# Patient Record
Sex: Female | Born: 1955
Health system: Southern US, Community
[De-identification: ages and names within clinical notes are randomized; demographics above are authoritative.]

## PROBLEM LIST (undated history)

## (undated) DIAGNOSIS — F419 Anxiety disorder, unspecified: Secondary | ICD-10-CM

## (undated) DIAGNOSIS — T7840XA Allergy, unspecified, initial encounter: Secondary | ICD-10-CM

## (undated) DIAGNOSIS — D573 Sickle-cell trait: Secondary | ICD-10-CM

## (undated) DIAGNOSIS — Z8719 Personal history of other diseases of the digestive system: Secondary | ICD-10-CM

## (undated) DIAGNOSIS — Z9889 Other specified postprocedural states: Secondary | ICD-10-CM

## (undated) DIAGNOSIS — K219 Gastro-esophageal reflux disease without esophagitis: Secondary | ICD-10-CM

## (undated) HISTORY — DX: Sickle-cell trait: D57.3

## (undated) HISTORY — DX: Other specified postprocedural states: Z98.890

## (undated) HISTORY — PX: TOTAL HIP ARTHROPLASTY: SHX124

## (undated) HISTORY — DX: Personal history of other diseases of the digestive system: Z87.19

## (undated) HISTORY — DX: Gastro-esophageal reflux disease without esophagitis: K21.9

## (undated) HISTORY — DX: Allergy, unspecified, initial encounter: T78.40XA

## (undated) HISTORY — DX: Anxiety disorder, unspecified: F41.9

## (undated) HISTORY — PX: COLONOSCOPY: SHX174

---

## 1999-01-27 ENCOUNTER — Ambulatory Visit (HOSPITAL_COMMUNITY): Admission: RE | Admit: 1999-01-27 | Discharge: 1999-01-27 | Payer: Self-pay | Admitting: Gastroenterology

## 1999-01-27 ENCOUNTER — Encounter: Payer: Self-pay | Admitting: Gastroenterology

## 1999-12-11 ENCOUNTER — Emergency Department (HOSPITAL_COMMUNITY): Admission: EM | Admit: 1999-12-11 | Discharge: 1999-12-11 | Payer: Self-pay | Admitting: Emergency Medicine

## 1999-12-18 ENCOUNTER — Emergency Department (HOSPITAL_COMMUNITY): Admission: EM | Admit: 1999-12-18 | Discharge: 1999-12-18 | Payer: Self-pay | Admitting: Emergency Medicine

## 2000-08-10 ENCOUNTER — Encounter: Payer: Self-pay | Admitting: Obstetrics and Gynecology

## 2000-08-10 ENCOUNTER — Ambulatory Visit (HOSPITAL_COMMUNITY): Admission: RE | Admit: 2000-08-10 | Discharge: 2000-08-10 | Payer: Self-pay | Admitting: Obstetrics and Gynecology

## 2001-06-10 ENCOUNTER — Emergency Department (HOSPITAL_COMMUNITY): Admission: EM | Admit: 2001-06-10 | Discharge: 2001-06-11 | Payer: Self-pay | Admitting: Emergency Medicine

## 2001-06-11 ENCOUNTER — Encounter: Payer: Self-pay | Admitting: Emergency Medicine

## 2001-12-22 ENCOUNTER — Other Ambulatory Visit: Admission: RE | Admit: 2001-12-22 | Discharge: 2001-12-22 | Payer: Self-pay | Admitting: Internal Medicine

## 2003-09-07 ENCOUNTER — Ambulatory Visit (HOSPITAL_COMMUNITY): Admission: RE | Admit: 2003-09-07 | Discharge: 2003-09-07 | Payer: Self-pay | Admitting: Obstetrics and Gynecology

## 2004-06-26 ENCOUNTER — Ambulatory Visit: Payer: Self-pay | Admitting: Internal Medicine

## 2004-10-22 ENCOUNTER — Ambulatory Visit: Payer: Self-pay | Admitting: Internal Medicine

## 2004-10-24 ENCOUNTER — Ambulatory Visit: Payer: Self-pay | Admitting: Internal Medicine

## 2004-11-03 ENCOUNTER — Ambulatory Visit: Payer: Self-pay | Admitting: Internal Medicine

## 2004-11-24 ENCOUNTER — Ambulatory Visit: Payer: Self-pay | Admitting: Gastroenterology

## 2004-11-27 ENCOUNTER — Ambulatory Visit: Payer: Self-pay | Admitting: Internal Medicine

## 2005-03-23 ENCOUNTER — Ambulatory Visit (HOSPITAL_COMMUNITY): Admission: RE | Admit: 2005-03-23 | Discharge: 2005-03-23 | Payer: Self-pay | Admitting: Obstetrics and Gynecology

## 2005-08-19 ENCOUNTER — Ambulatory Visit: Payer: Self-pay | Admitting: Internal Medicine

## 2005-09-04 ENCOUNTER — Ambulatory Visit: Payer: Self-pay | Admitting: Gastroenterology

## 2006-07-09 ENCOUNTER — Ambulatory Visit: Payer: Self-pay | Admitting: Internal Medicine

## 2006-07-09 LAB — CONVERTED CEMR LAB
Albumin: 3.9 g/dL (ref 3.5–5.2)
BUN: 13 mg/dL (ref 6–23)
Basophils Relative: 0.3 % (ref 0.0–1.0)
Bilirubin Urine: NEGATIVE
CO2: 27 meq/L (ref 19–32)
Chol/HDL Ratio, serum: 2.2
Cholesterol: 138 mg/dL (ref 0–200)
Eosinophil percent: 1.5 % (ref 0.0–5.0)
Glomerular Filtration Rate, Af Am: 86 mL/min/{1.73_m2}
LDL Cholesterol: 68 mg/dL (ref 0–99)
Leukocytes, UA: NEGATIVE
MCHC: 32.3 g/dL (ref 30.0–36.0)
Monocytes Absolute: 0.3 10*3/uL (ref 0.2–0.7)
Neutrophils Relative %: 38.5 % — ABNORMAL LOW (ref 43.0–77.0)
Nitrite: NEGATIVE
RBC: 4.08 M/uL (ref 3.87–5.11)
Sodium: 138 meq/L (ref 135–145)
Specific Gravity, Urine: 1.01 (ref 1.000–1.03)
TSH: 0.77 microintl units/mL (ref 0.35–5.50)
Total Bilirubin: 0.5 mg/dL (ref 0.3–1.2)
Total Protein, Urine: NEGATIVE mg/dL
Total Protein: 6.9 g/dL (ref 6.0–8.3)
Urine Glucose: NEGATIVE mg/dL
WBC: 3.9 10*3/uL — ABNORMAL LOW (ref 4.5–10.5)

## 2006-07-13 ENCOUNTER — Ambulatory Visit: Payer: Self-pay | Admitting: Internal Medicine

## 2006-08-30 ENCOUNTER — Ambulatory Visit: Payer: Self-pay | Admitting: Internal Medicine

## 2006-09-28 ENCOUNTER — Encounter: Admission: RE | Admit: 2006-09-28 | Discharge: 2006-09-28 | Payer: Self-pay | Admitting: Otolaryngology

## 2007-04-09 ENCOUNTER — Encounter: Payer: Self-pay | Admitting: Internal Medicine

## 2007-04-09 DIAGNOSIS — K209 Esophagitis, unspecified without bleeding: Secondary | ICD-10-CM | POA: Insufficient documentation

## 2007-04-09 DIAGNOSIS — K589 Irritable bowel syndrome without diarrhea: Secondary | ICD-10-CM

## 2007-04-09 DIAGNOSIS — K219 Gastro-esophageal reflux disease without esophagitis: Secondary | ICD-10-CM | POA: Insufficient documentation

## 2007-04-09 DIAGNOSIS — D509 Iron deficiency anemia, unspecified: Secondary | ICD-10-CM

## 2007-04-09 DIAGNOSIS — Z9109 Other allergy status, other than to drugs and biological substances: Secondary | ICD-10-CM

## 2007-04-15 ENCOUNTER — Encounter: Payer: Self-pay | Admitting: Internal Medicine

## 2007-04-15 DIAGNOSIS — G4700 Insomnia, unspecified: Secondary | ICD-10-CM | POA: Insufficient documentation

## 2007-04-15 DIAGNOSIS — F329 Major depressive disorder, single episode, unspecified: Secondary | ICD-10-CM

## 2007-04-15 DIAGNOSIS — F411 Generalized anxiety disorder: Secondary | ICD-10-CM | POA: Insufficient documentation

## 2007-10-17 ENCOUNTER — Ambulatory Visit: Payer: Self-pay | Admitting: Internal Medicine

## 2007-10-18 ENCOUNTER — Ambulatory Visit: Payer: Self-pay | Admitting: Internal Medicine

## 2007-10-18 LAB — CONVERTED CEMR LAB
Albumin: 4.2 g/dL (ref 3.5–5.2)
Basophils Relative: 0.7 % (ref 0.0–1.0)
Bilirubin, Direct: 0.1 mg/dL (ref 0.0–0.3)
Calcium: 9.4 mg/dL (ref 8.4–10.5)
Chloride: 103 meq/L (ref 96–112)
Cholesterol: 156 mg/dL (ref 0–200)
Eosinophils Relative: 1.8 % (ref 0.0–5.0)
GFR calc Af Amer: 98 mL/min
GFR calc non Af Amer: 81 mL/min
HCT: 37.8 % (ref 36.0–46.0)
LDL Cholesterol: 81 mg/dL (ref 0–99)
Monocytes Relative: 4.8 % (ref 3.0–11.0)
Neutro Abs: 2.3 10*3/uL (ref 1.4–7.7)
Neutrophils Relative %: 48.4 % (ref 43.0–77.0)
RDW: 13.2 % (ref 11.5–14.6)
Total Bilirubin: 0.5 mg/dL (ref 0.3–1.2)
Total CHOL/HDL Ratio: 2.6
Total Protein: 7.6 g/dL (ref 6.0–8.3)
VLDL: 15 mg/dL (ref 0–40)
WBC: 4.7 10*3/uL (ref 4.5–10.5)

## 2007-12-29 ENCOUNTER — Ambulatory Visit: Payer: Self-pay | Admitting: Internal Medicine

## 2007-12-29 DIAGNOSIS — R1032 Left lower quadrant pain: Secondary | ICD-10-CM

## 2007-12-29 DIAGNOSIS — M549 Dorsalgia, unspecified: Secondary | ICD-10-CM | POA: Insufficient documentation

## 2007-12-29 DIAGNOSIS — R609 Edema, unspecified: Secondary | ICD-10-CM | POA: Insufficient documentation

## 2007-12-30 LAB — CONVERTED CEMR LAB
AST: 22 units/L (ref 0–37)
Albumin: 4 g/dL (ref 3.5–5.2)
BUN: 10 mg/dL (ref 6–23)
Bilirubin Urine: NEGATIVE
Bilirubin, Direct: 0.1 mg/dL (ref 0.0–0.3)
CO2: 30 meq/L (ref 19–32)
Eosinophils Absolute: 0.1 10*3/uL (ref 0.0–0.7)
Eosinophils Relative: 1.4 % (ref 0.0–5.0)
GFR calc Af Amer: 85 mL/min
HCT: 33.8 % — ABNORMAL LOW (ref 36.0–46.0)
Hemoglobin: 11.6 g/dL — ABNORMAL LOW (ref 12.0–15.0)
Ketones, ur: NEGATIVE mg/dL
Leukocytes, UA: NEGATIVE
MCHC: 34.3 g/dL (ref 30.0–36.0)
Monocytes Absolute: 0.2 10*3/uL (ref 0.1–1.0)
Neutrophils Relative %: 47.1 % (ref 43.0–77.0)
Nitrite: NEGATIVE
Platelets: 218 10*3/uL (ref 150–400)
Potassium: 4.1 meq/L (ref 3.5–5.1)
RDW: 13.3 % (ref 11.5–14.6)
Sodium: 141 meq/L (ref 135–145)
Specific Gravity, Urine: 1.02 (ref 1.000–1.03)
TSH: 0.6 microintl units/mL (ref 0.35–5.50)
Urine Glucose: NEGATIVE mg/dL
Urobilinogen, UA: 0.2 (ref 0.0–1.0)
pH: 6 (ref 5.0–8.0)

## 2008-02-06 ENCOUNTER — Ambulatory Visit: Payer: Self-pay | Admitting: Internal Medicine

## 2008-02-06 DIAGNOSIS — R42 Dizziness and giddiness: Secondary | ICD-10-CM

## 2008-02-07 ENCOUNTER — Telehealth: Payer: Self-pay | Admitting: Internal Medicine

## 2008-02-10 ENCOUNTER — Telehealth: Payer: Self-pay | Admitting: Internal Medicine

## 2008-10-05 ENCOUNTER — Telehealth (INDEPENDENT_AMBULATORY_CARE_PROVIDER_SITE_OTHER): Payer: Self-pay | Admitting: *Deleted

## 2009-02-04 ENCOUNTER — Encounter (INDEPENDENT_AMBULATORY_CARE_PROVIDER_SITE_OTHER): Payer: Self-pay | Admitting: *Deleted

## 2010-08-27 ENCOUNTER — Emergency Department (HOSPITAL_COMMUNITY)
Admission: EM | Admit: 2010-08-27 | Discharge: 2010-08-27 | Disposition: A | Discharge status: Home / Self Care | Payer: Self-pay | Attending: Emergency Medicine | Admitting: Emergency Medicine

## 2010-08-27 DIAGNOSIS — M25569 Pain in unspecified knee: Secondary | ICD-10-CM | POA: Insufficient documentation

## 2010-08-27 DIAGNOSIS — Z87828 Personal history of other (healed) physical injury and trauma: Secondary | ICD-10-CM | POA: Insufficient documentation

## 2010-12-12 NOTE — Letter (Signed)
February 14, 2008    Noora Locascio  [address]  [place], [place] [address]   RE:  Teresa Orr, Teresa Orr  MRN:  [id number]  /  DOB:  [birth year]   Dear Ms. Roxan Hockey:   This letter is forwarded to you because I find that I am no longer able  to serve as your primary care physician.  This stems from the issue  regarding the misunderstanding over lab work that was drawn  inadvertently and other lab work that was advised to be done such as  specifically the zinc and copper level that you had mentioned at your  last office visit.  The statements regarding legal action over this  issue on your part when you stopped by the office today, make it  impossible for me to continue in the role of your primary care  physician.  Therefore, you are being dismissed as an active patient from  [facility].  You will be afforded emergency care for the next  30 days from receipt of this letter.   It is likely that you will need primary care management and there are  several excellent primary care physicians in the city of [place].  If  you have difficulty finding a new physician, please contact the  physician referal service at [facility] for a suggestion.  At anytime, we will be happy to forward any records you may request to  your new physician.    Sincerely,      Corwin Levins, MD  Electronically Signed    JWJ/MedQ  DD: 02/14/2008  DT: 02/15/2008  Job #: [id number]

## 2011-11-23 ENCOUNTER — Other Ambulatory Visit: Payer: Self-pay | Admitting: Internal Medicine

## 2011-11-23 DIAGNOSIS — Z1231 Encounter for screening mammogram for malignant neoplasm of breast: Secondary | ICD-10-CM

## 2011-12-01 ENCOUNTER — Ambulatory Visit: Payer: Self-pay

## 2011-12-08 ENCOUNTER — Ambulatory Visit: Payer: Self-pay

## 2013-11-06 ENCOUNTER — Encounter: Payer: Self-pay | Admitting: Internal Medicine

## 2013-12-21 ENCOUNTER — Encounter: Payer: Self-pay | Admitting: Internal Medicine

## 2013-12-26 ENCOUNTER — Encounter: Payer: Self-pay | Admitting: Internal Medicine

## 2013-12-26 ENCOUNTER — Ambulatory Visit (INDEPENDENT_AMBULATORY_CARE_PROVIDER_SITE_OTHER): Payer: BC Managed Care – PPO | Admitting: Internal Medicine

## 2013-12-26 ENCOUNTER — Other Ambulatory Visit (INDEPENDENT_AMBULATORY_CARE_PROVIDER_SITE_OTHER): Payer: BC Managed Care – PPO

## 2013-12-26 VITALS — BP 100/72 | HR 80 | Ht 64.75 in | Wt 142.4 lb

## 2013-12-26 DIAGNOSIS — Z1211 Encounter for screening for malignant neoplasm of colon: Secondary | ICD-10-CM

## 2013-12-26 DIAGNOSIS — R194 Change in bowel habit: Secondary | ICD-10-CM

## 2013-12-26 DIAGNOSIS — R198 Other specified symptoms and signs involving the digestive system and abdomen: Secondary | ICD-10-CM

## 2013-12-26 DIAGNOSIS — M545 Low back pain, unspecified: Secondary | ICD-10-CM

## 2013-12-26 DIAGNOSIS — Z7189 Other specified counseling: Secondary | ICD-10-CM

## 2013-12-26 DIAGNOSIS — Z7689 Persons encountering health services in other specified circumstances: Secondary | ICD-10-CM

## 2013-12-26 DIAGNOSIS — K219 Gastro-esophageal reflux disease without esophagitis: Secondary | ICD-10-CM

## 2013-12-26 LAB — COMPREHENSIVE METABOLIC PANEL
ALK PHOS: 73 U/L (ref 39–117)
ALT: 18 U/L (ref 0–35)
AST: 19 U/L (ref 0–37)
Albumin: 4.4 g/dL (ref 3.5–5.2)
BUN: 10 mg/dL (ref 6–23)
CHLORIDE: 106 meq/L (ref 96–112)
CO2: 28 meq/L (ref 19–32)
Calcium: 9.5 mg/dL (ref 8.4–10.5)
Creatinine, Ser: 0.7 mg/dL (ref 0.4–1.2)
GFR: 107.37 mL/min (ref >60.00)
GLUCOSE: 98 mg/dL (ref 70–99)
POTASSIUM: 4.2 meq/L (ref 3.5–5.1)
SODIUM: 141 meq/L (ref 135–145)
TOTAL PROTEIN: 7.7 g/dL (ref 6.0–8.3)
Total Bilirubin: 0.6 mg/dL (ref 0.2–1.2)

## 2013-12-26 LAB — CBC
HCT: 38.3 % (ref 36.0–46.0)
Hemoglobin: 12.5 g/dL (ref 12.0–15.0)
MCHC: 32.6 g/dL (ref 30.0–36.0)
MCV: 87.6 fl (ref 78.0–100.0)
PLATELETS: 225 10*3/uL (ref 150.0–400.0)
RBC: 4.36 Mil/uL (ref 3.87–5.11)
RDW: 13.7 % (ref 11.5–15.5)
WBC: 4 10*3/uL (ref 4.0–10.5)

## 2013-12-26 LAB — TSH: TSH: 0.57 u[IU]/mL (ref 0.35–4.50)

## 2013-12-26 MED ORDER — MOVIPREP 100 G PO SOLR: ORAL | Status: DC

## 2013-12-26 NOTE — Patient Instructions (Addendum)
You have been scheduled for a colonoscopy with propofol. Please follow written instructions given to you at your visit today.  Please pick up your prep kit at the pharmacy within the next 1-3 days. If you use inhalers (even only as needed), please bring them with you on the day of your procedure. Your physician has requested that you go to www.startemmi.com and enter the access code given to you at your visit today. This web site gives a general overview about your procedure. However, you should still follow specific instructions given to you by our office regarding your preparation for the procedure.  Your physician has requested that you go to the basement for the following lab work before leaving today: CBC, CMP, TSH  We have sent the following medications to your pharmacy for you to pick up at your convenience: Moviprep   You have been referred to Primary care physicain Teresa Orr @ [facility]. That office will contact you to schedule your appointment   [address], [place], [place] [address] Phone: [phone]

## 2013-12-26 NOTE — Progress Notes (Signed)
Patient ID: Teresa Orr, female   DOB: 1955/08/05, 58 y.o.   MRN: [id number] HPI:  Teresa Orr is a 58 year old female with a past medical history of anxiety and GERD who is seen to evaluate change in bowel habit and to consider rectal cancer screening. She is here alone today. She reports she has not had primary care recently due to lack of medical insurance. She is now employed and has insurance again. She reports a GI history with Dr. Lyla Son.  She recalls previous upper endoscopy for dilation as well as colonoscopy. She's unsure when these procedures were but thinks they were over 10 years ago. She reports chronic reflux disease currently treated with Tagamet daily. With this she denies heartburn. She's had no return of dysphagia and no odynophagia. She reports good appetite without nausea or vomiting. No early satiety. She has lost about 10 pounds over the last several months which she relates to personal "stress". She does report a change in her bowel habit in that her bowel movements are now more irregular. She reports she has a bowel movement approximately 3-4 days a week but can have multiple bowel movements on a given day. Her stools can be looser than before and also be associated with fecal urgency. Occasionally she has bilateral lower quadrant abdominal discomfort which is sharp in nature. She doesn't always associate this with bowel movement. She reports possible red blood in her stool 1 day but she is uncertain. She does feel there's been a color change and her stools are more green. She reports lower back pain which can radiate into her bilateral buttocks and also posterior leg. No numbness or weakness. She denies a family history of colon cancer or polyps. She requests to reestablish with primary care, but requests a female provider closer to [facility] where she is living.  Past Medical History  Diagnosis Date  . Anxiety   . GERD (gastroesophageal reflux disease)   . Status post  dilation of esophageal narrowing     Past Surgical History  Procedure Laterality Date  . Cesarean section      Current Outpatient Prescriptions  Medication Sig Dispense Refill  . cetirizine (ZYRTEC) 10 MG tablet Take 10 mg by mouth daily.      . Cimetidine (TAGAMET PO) Take 1 tablet by mouth daily.      Marland Kitchen MOVIPREP 100 G SOLR Use per prep instruction  1 kit  0   No current facility-administered medications for this visit.    Allergies  Allergen Reactions  . Esomeprazole Magnesium   . Hydrocod Polst-Cpm Polst Er   . Nexium [Esomeprazole Magnesium]     Family History  Problem Relation Age of Onset  . Adopted: Yes  . Diabetes Other     10 out of 13 siblings  . Lung cancer Mother   . Hypertension Other     siblings    History  Substance Use Topics  . Smoking status: Never Smoker   . Smokeless tobacco: Never Used  . Alcohol Use: Yes     Comment: 1 per day    ROS: As per history of present illness, otherwise negative  BP 100/72  Pulse 80  Ht 5' 4.75" (1.645 m)  Wt 142 lb 6 oz (64.581 kg)  BMI 23.87 kg/m2 Constitutional: Well-developed and well-nourished. No distress. HEENT: Normocephalic and atraumatic. Oropharynx is clear and moist. No oropharyngeal exudate. Conjunctivae are normal.  No scleral icterus. Neck: Neck supple. Trachea midline. Cardiovascular: Normal rate, regular rhythm and  intact distal pulses. No M/R/G Pulmonary/chest: Effort normal and breath sounds normal. No wheezing, rales or rhonchi. Abdominal: Soft, nontender, nondistended. Bowel sounds active throughout. There are no masses palpable. No hepatosplenomegaly. Extremities: no clubbing, cyanosis, or edema Lymphadenopathy: No cervical adenopathy noted. Neurological: Alert and oriented to person place and time. Skin: Skin is warm and dry. No rashes noted. Psychiatric: Normal mood and affect. Behavior is normal.  RELEVANT LABS  Ordered today  -Records from previous endoscopy and colonoscopy not  available in the medical record  ASSESSMENT/PLAN:  58 year old female with a past medical history of anxiety and GERD who is seen to evaluate change in bowel habit and to consider rectal cancer screening.  1.  CRC screening/change in bowel habits -- have recommended colonoscopy for screening and also to evaluate her change in bowel habit. We discussed colonoscopy including the risks and benefits and she is agreeable to proceed. I will check labs today to include CBC, CMP and TSH. I have given her samples of Restora as a probiotic to help regulate bowel movements.  2.  GERD -- no alarm symptoms and symptoms well-controlled H2 blocker. She will continue over-the-counter H2 blocker for now. No recurrent dysphagia and therefore no indication for endoscopy at this time  3.  Lower back pain -- asked that she address this with primary care at her upcoming appointment  4.  Primary care -- referral placed for [facility], request female MD

## 2013-12-27 ENCOUNTER — Encounter: Payer: Self-pay | Admitting: Internal Medicine

## 2014-01-04 ENCOUNTER — Encounter: Payer: Self-pay | Admitting: Internal Medicine

## 2014-01-04 ENCOUNTER — Ambulatory Visit (AMBULATORY_SURGERY_CENTER): Payer: BC Managed Care – PPO | Admitting: Internal Medicine

## 2014-01-04 VITALS — BP 120/85 | HR 78 | Temp 96.3°F | Resp 10 | Ht 64.0 in | Wt 142.0 lb

## 2014-01-04 DIAGNOSIS — Z1211 Encounter for screening for malignant neoplasm of colon: Secondary | ICD-10-CM

## 2014-01-04 DIAGNOSIS — D126 Benign neoplasm of colon, unspecified: Secondary | ICD-10-CM

## 2014-01-04 MED ORDER — SODIUM CHLORIDE 0.9 % IV SOLN: 500.0000 mL | INTRAVENOUS | Status: DC

## 2014-01-04 NOTE — Progress Notes (Signed)
Called to room to assist during endoscopic procedure.  Patient ID and intended procedure confirmed with present staff. Received instructions for my participation in the procedure from the performing physician.  

## 2014-01-04 NOTE — Patient Instructions (Signed)
Discharge instructions given with verbal understanding. Handout on polyps. Resume previous medications. YOU HAD AN ENDOSCOPIC PROCEDURE TODAY AT THE [facility]: Refer to the procedure report that was given to you for any specific questions about what was found during the examination.  If the procedure report does not answer your questions, please call your gastroenterologist to clarify.  If you requested that your care partner not be given the details of your procedure findings, then the procedure report has been included in a sealed envelope for you to review at your convenience later.  YOU SHOULD EXPECT: Some feelings of bloating in the abdomen. Passage of more gas than usual.  Walking can help get rid of the air that was put into your GI tract during the procedure and reduce the bloating. If you had a lower endoscopy (such as a colonoscopy or flexible sigmoidoscopy) you may notice spotting of blood in your stool or on the toilet paper. If you underwent a bowel prep for your procedure, then you may not have a normal bowel movement for a few days.  DIET: Your first meal following the procedure should be a light meal and then it is ok to progress to your normal diet.  A half-sandwich or bowl of soup is an example of a good first meal.  Heavy or fried foods are harder to digest and may make you feel nauseous or bloated.  Likewise meals heavy in dairy and vegetables can cause extra gas to form and this can also increase the bloating.  Drink plenty of fluids but you should avoid alcoholic beverages for 24 hours.  ACTIVITY: Your care partner should take you home directly after the procedure.  You should plan to take it easy, moving slowly for the rest of the day.  You can resume normal activity the day after the procedure however you should NOT DRIVE or use heavy machinery for 24 hours (because of the sedation medicines used during the test).    SYMPTOMS TO REPORT IMMEDIATELY: A  gastroenterologist can be reached at any hour.  During normal business hours, 8:30 AM to 5:00 PM Monday through Friday, call [phone].  After hours and on weekends, please call the GI answering service at [phone] who will take a message and have the physician on call contact you.   Following lower endoscopy (colonoscopy or flexible sigmoidoscopy):  Excessive amounts of blood in the stool  Significant tenderness or worsening of abdominal pains  Swelling of the abdomen that is new, acute  Fever of 100F or higher  FOLLOW UP: If any biopsies were taken you will be contacted by phone or by letter within the next 1-3 weeks.  Call your gastroenterologist if you have not heard about the biopsies in 3 weeks.  Our staff will call the home number listed on your records the next business day following your procedure to check on you and address any questions or concerns that you may have at that time regarding the information given to you following your procedure. This is a courtesy call and so if there is no answer at the home number and we have not heard from you through the emergency physician on call, we will assume that you have returned to your regular daily activities without incident.  SIGNATURES/CONFIDENTIALITY: You and/or your care partner have signed paperwork which will be entered into your electronic medical record.  These signatures attest to the fact that that the information above on your After Visit Summary has been   reviewed and is understood.  Full responsibility of the confidentiality of this discharge information lies with you and/or your care-partner. 

## 2014-01-04 NOTE — Progress Notes (Signed)
Pt stable to RR 

## 2014-01-04 NOTE — Progress Notes (Signed)
Stable to RR 

## 2014-01-04 NOTE — Op Note (Signed)
[place]  [address] [place], [address]   COLONOSCOPY PROCEDURE REPORT  PATIENT: Teresa Orr, Teresa Orr  MR#: [id number] BIRTHDATE: 05-02-1956 , 48  yrs. old GENDER: Female ENDOSCOPIST: Jerene Bears, MD PROCEDURE DATE:  01/04/2014 PROCEDURE:   Colonoscopy with cold biopsy polypectomy First Screening Colonoscopy - Avg.  risk and is 50 yrs.  old or older - No.  Prior Negative Screening - Now for repeat screening. N/A  History of Adenoma - Now for follow-up colonoscopy & has been > or = to 3 yrs.  N/A  Polyps Removed Today? Yes. ASA CLASS:   Class II INDICATIONS:average risk screening and Change in bowel habits. MEDICATIONS: MAC sedation, administered by CRNA and propofol (Diprivan) 200mg  IV  DESCRIPTION OF PROCEDURE:   After the risks benefits and alternatives of the procedure were thoroughly explained, informed consent was obtained.  A digital rectal exam revealed no rectal mass.   The LB [id number]  endoscope was introduced through the anus and advanced to the cecum, which was identified by both the appendix and ileocecal valve. No adverse events experienced. The quality of the prep was good, using MoviPrep  The instrument was then slowly withdrawn as the colon was fully examined.   COLON FINDINGS: Two sessile polyps ranging between 3-28mm in size were found in the transverse colon.  Polypectomy was performed with cold forceps.  All resections were complete and all polyp tissue was completely retrieved.   The colon mucosa was otherwise normal. Retroflexed views revealed no abnormalities. The time to cecum=3 minutes 02 seconds.  Withdrawal time=11 minutes 49 seconds.  The scope was withdrawn and the procedure completed. COMPLICATIONS: There were no complications.  ENDOSCOPIC IMPRESSION: 1.   Two sessile polyps ranging between 3-80mm in size were found in the transverse colon; Polypectomy was performed with cold forceps 2.   The colon mucosa was otherwise  normal  RECOMMENDATIONS: 1.  Await pathology results 2.  If the polyps removed today are proven to be adenomatous (pre-cancerous) polyps, you will need a repeat colonoscopy in 5 years.  Otherwise you should continue to follow colorectal cancer screening guidelines for "routine risk" patients with colonoscopy in 10 years.  You will receive a letter within 1-2 weeks with the results of your biopsy as well as final recommendations.  Please call my office if you have not received a letter after 3 weeks.   eSigned:  Jerene Bears, MD 01/04/2014 12:19 PM   cc: The Patient and Newton Pigg, MD

## 2014-01-05 ENCOUNTER — Telehealth: Payer: Self-pay | Admitting: *Deleted

## 2014-01-05 NOTE — Telephone Encounter (Signed)
No answer, left message to call if questions or concerns. 

## 2014-01-09 ENCOUNTER — Encounter: Payer: Self-pay | Admitting: Internal Medicine

## 2015-01-21 ENCOUNTER — Ambulatory Visit (INDEPENDENT_AMBULATORY_CARE_PROVIDER_SITE_OTHER): Payer: BLUE CROSS/BLUE SHIELD | Admitting: Family Medicine

## 2015-01-21 VITALS — BP 122/80 | HR 66 | Temp 97.6°F | Resp 18 | Ht 65.0 in | Wt 139.0 lb

## 2015-01-21 DIAGNOSIS — N390 Urinary tract infection, site not specified: Secondary | ICD-10-CM

## 2015-01-21 DIAGNOSIS — R5383 Other fatigue: Secondary | ICD-10-CM

## 2015-01-21 DIAGNOSIS — R634 Abnormal weight loss: Secondary | ICD-10-CM | POA: Diagnosis not present

## 2015-01-21 DIAGNOSIS — J302 Other seasonal allergic rhinitis: Secondary | ICD-10-CM | POA: Diagnosis not present

## 2015-01-21 DIAGNOSIS — R42 Dizziness and giddiness: Secondary | ICD-10-CM | POA: Diagnosis not present

## 2015-01-21 LAB — COMPLETE METABOLIC PANEL WITHOUT GFR
ALT: 15 U/L (ref 0–35)
AST: 19 U/L (ref 0–37)
CO2: 28 meq/L (ref 19–32)
Creat: 0.82 mg/dL (ref 0.50–1.10)

## 2015-01-21 LAB — POCT URINALYSIS DIPSTICK
Bilirubin, UA: NEGATIVE
Blood, UA: NEGATIVE
Glucose, UA: NEGATIVE
Ketones, UA: NEGATIVE
Nitrite, UA: NEGATIVE
Protein, UA: NEGATIVE
Spec Grav, UA: 1.015
Urobilinogen, UA: 0.2
pH, UA: 7

## 2015-01-21 LAB — COMPLETE METABOLIC PANEL WITH GFR
Albumin: 4.6 g/dL (ref 3.5–5.2)
Alkaline Phosphatase: 71 U/L (ref 39–117)
BUN: 12 mg/dL (ref 6–23)
Calcium: 9.4 mg/dL (ref 8.4–10.5)
Chloride: 104 mEq/L (ref 96–112)
GFR, Est African American: >89 mL/min
GFR, Est Non African American: 79 mL/min
Glucose, Bld: 95 mg/dL (ref 70–99)
Potassium: 4.1 mEq/L (ref 3.5–5.3)
Sodium: 143 mEq/L (ref 135–145)
Total Bilirubin: 0.3 mg/dL (ref 0.2–1.2)
Total Protein: 7.6 g/dL (ref 6.0–8.3)

## 2015-01-21 LAB — POCT CBC
Granulocyte percent: 39.9 %G (ref 37–80)
HCT, POC: 39 % (ref 37.7–47.9)
Hemoglobin: 12.3 g/dL (ref 12.2–16.2)
Lymph, poc: 2.4 (ref 0.6–3.4)
MCH, POC: 27.1 pg (ref 27–31.2)
MCHC: 31.6 g/dL — AB (ref 31.8–35.4)
MCV: 85.7 fL (ref 80–97)
MID (cbc): 0.3 (ref 0–0.9)
MPV: 7.3 fL (ref 0–99.8)
POC Granulocyte: 1.8 — AB (ref 2–6.9)
POC LYMPH PERCENT: 54.2 % — AB (ref 10–50)
POC MID %: 5.9 %M (ref 0–12)
Platelet Count, POC: 209 10*3/uL (ref 142–424)
RBC: 4.55 M/uL (ref 4.04–5.48)
RDW, POC: 12.8 %
WBC: 4.5 10*3/uL — AB (ref 4.6–10.2)

## 2015-01-21 LAB — VITAMIN B12: Vitamin B-12: 365 pg/mL (ref 211–911)

## 2015-01-21 LAB — POCT UA - MICROSCOPIC ONLY
Bacteria, U Microscopic: NEGATIVE
Casts, Ur, LPF, POC: NEGATIVE
Crystals, Ur, HPF, POC: NEGATIVE
Mucus, UA: NEGATIVE
RBC, urine, microscopic: NEGATIVE
Yeast, UA: NEGATIVE

## 2015-01-21 LAB — GLUCOSE, POCT (MANUAL RESULT ENTRY): POC Glucose: 106 mg/dl — AB (ref 70–99)

## 2015-01-21 LAB — TSH: TSH: 0.678 u[IU]/mL (ref 0.350–4.500)

## 2015-01-21 MED ORDER — CEPHALEXIN 500 MG PO CAPS: 500.0000 mg | ORAL_CAPSULE | Freq: Four times a day (QID) | ORAL | Status: DC

## 2015-01-21 NOTE — Patient Instructions (Signed)

## 2015-01-21 NOTE — Progress Notes (Signed)
 Chief Complaint:  Chief Complaint  Patient presents with  . Fatigue    since yesterday   . GI Problem    stomach just does not feel right   . Headache  . Torticollis    works in a lot dust x1 mth now     HPI: Teresa Orr is a 59 y.o. female who is here for 1 month dizziness, headache, neck stays stiff and she has allergies. She feels sick to her stomach but not anuseated. She is on 3rd shift. She is not eating , she started third shift because she thought she was downsizing and being proactive by switching to another job but it is at a different time. She is .   Her company did not downsize but keep sending people home early.  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   Yesterday she had a frontal HA and took ibuprofen and helped but lasted all day, not the worst of her life, 7 out of 10 diffuse temporal pain , no eye pain, ascribed as a aching pain. No nause or vomiting. But feels stomach "is not feeling good". Not drinking well. No CP SOB or palpitations. Denies any diaphoresis.   She also has had some stomach upsets/cramps , growling. No diarrhea, no constipation, no new foods, meds, travels, no sick contacts.  When her allergies flaire up feels she has the tingles in her sinuses.. Her face feels numb and tingling. Not new for her. The stomacha nd dizziness and weakness is new. She takes zyrtec D   Past Medical History  Diagnosis Date  . Anxiety   . GERD (gastroesophageal reflux disease)   . Status post dilation of esophageal narrowing   . Sickle cell trait    Past Surgical History  Procedure Laterality Date  . Cesarean section    . Colonoscopy     History   Social History  . Marital Status: Single    Spouse Name: N/A  . Number of Children: 3  . Years of Education: N/A   Occupational History  .     Social History Main Topics  . Smoking status: Never Smoker   . Smokeless tobacco: Never Used  . Alcohol Use: 1.2 oz/week    2 Standard drinks or  equivalent per week     Comment: 1 per day  . Drug Use: No  . Sexual Activity: Not on file   Other Topics Concern  . None   Social History Narrative   Family History  Problem Relation Age of Onset  . Adopted: Yes  . Diabetes Other     10 out of 13 siblings  . Lung cancer Mother   . Cancer Mother   . Diabetes Mother   . Hypertension Other     siblings  . Diabetes Sister   . Hyperlipidemia Sister   . Diabetes Brother   . Hyperlipidemia Brother    Allergies  Allergen Reactions  . Esomeprazole Magnesium   . Hydrocod Polst-Cpm Polst Er   . Nexium [Esomeprazole Magnesium]    Prior to Admission medications   Medication Sig Start Date End Date Taking? Authorizing Provider  cetirizine-pseudoephedrine (ZYRTEC-D) 5-120 MG per tablet Take 1 tablet by mouth as needed for allergies.   Yes Historical Provider, MD  Cimetidine (TAGAMET PO) Take 1 tablet by mouth daily.   Yes Historical Provider, MD  diphenhydrAMINE (BENADRYL) 25 MG tablet Take 25 mg by mouth every 6 (six) hours as needed.  Yes Historical Provider, MD  traZODone (DESYREL) 50 MG tablet Take 50 mg by mouth at bedtime.   Yes Historical Provider, MD     ROS: The patient denies fevers, chills, night sweats, unintentional weight loss, chest pain, palpitations, wheezing, dyspnea on exertion, nausea, vomiting, abdominal pain, dysuria, hematuria, melena, numbness,  or tingling.  All other systems have been reviewed and were otherwise negative with the exception of those mentioned in the HPI and as above.    PHYSICAL EXAM: Filed Vitals:   01/21/15 0930  BP: 122/80  Pulse: 66  Temp: 97.6 F (36.4 C)  Resp: 18   Filed Vitals:   01/21/15 0930  Height: 5\' 5"  (1.651 m)  Weight: 139 lb (63.05 kg)   Body mass index is 23.13 kg/(m^2).   General: Alert, no acute distress HEENT:  Normocephalic, atraumatic, oropharynx patent. EOMI, PERRLA, fundoscopic exam normal Minimal sinus tenderness, TM nl, OP nl Cardiovascular:   Regular rate and rhythm, no rubs murmurs or gallops.  No Carotid bruits, radial pulse intact. No pedal edema.  Respiratory: Clear to auscultation bilaterally.  No wheezes, rales, or rhonchi.  No cyanosis, no use of accessory musculature GI: No organomegaly, abdomen is soft and non-tender, positive bowel sounds.  No masses. Skin: No rashes. Neurologic: Facial musculature symmetric. Psychiatric: Patient is appropriate throughout our interaction. Lymphatic: No cervical lymphadenopathy Musculoskeletal: Gait intact. + paramsk tenderness traps and SCM tender NEg Spurling, neg nuchal rigidity  Full ROM 5/5 strength 2/2 DTRs UE    LABS: Results for orders placed or performed in visit on 01/21/15  Urine culture  Result Value Ref Range   Colony Count NO GROWTH    Organism ID, Bacteria NO GROWTH   COMPLETE METABOLIC PANEL WITH GFR  Result Value Ref Range   Sodium 143 135 - 145 mEq/L   Potassium 4.1 3.5 - 5.3 mEq/L   Chloride 104 96 - 112 mEq/L   CO2 28 19 - 32 mEq/L   Glucose, Bld 95 70 - 99 mg/dL   BUN 12 6 - 23 mg/dL   Creat 0.82 0.50 - 1.10 mg/dL   Total Bilirubin 0.3 0.2 - 1.2 mg/dL   Alkaline Phosphatase 71 39 - 117 U/L   AST 19 0 - 37 U/L   ALT 15 0 - 35 U/L   Total Protein 7.6 6.0 - 8.3 g/dL   Albumin 4.6 3.5 - 5.2 g/dL   Calcium 9.4 8.4 - 10.5 mg/dL   GFR, Est African American >89 mL/min   GFR, Est Non African American 79 mL/min  TSH  Result Value Ref Range   TSH 0.678 0.350 - 4.500 uIU/mL  Vitamin D, 25-hydroxy  Result Value Ref Range   Vit D, 25-Hydroxy 19 (L) 30 - 100 ng/mL  Vitamin B12  Result Value Ref Range   Vitamin B-12 365 211 - 911 pg/mL  POCT CBC  Result Value Ref Range   WBC 4.5 (A) 4.6 - 10.2 K/uL   Lymph, poc 2.4 0.6 - 3.4   POC LYMPH PERCENT 54.2 (A) 10 - 50 %L   MID (cbc) 0.3 0 - 0.9   POC MID % 5.9 0 - 12 %M   POC Granulocyte 1.8 (A) 2 - 6.9   Granulocyte percent 39.9 37 - 80 %G   RBC 4.55 4.04 - 5.48 M/uL   Hemoglobin 12.3 12.2 - 16.2 g/dL    HCT, POC 39.0 37.7 - 47.9 %   MCV 85.7 80 - 97 fL   MCH, POC 27.1 27 -  31.2 pg   MCHC 31.6 (A) 31.8 - 35.4 g/dL   RDW, POC 12.8 %   Platelet Count, POC 209 142 - 424 K/uL   MPV 7.3 0 - 99.8 fL  POCT UA - Microscopic Only  Result Value Ref Range   WBC, Ur, HPF, POC 0-4    RBC, urine, microscopic neg    Bacteria, U Microscopic neg    Mucus, UA neg    Epithelial cells, urine per micros 0-1    Crystals, Ur, HPF, POC neg    Casts, Ur, LPF, POC neg    Yeast, UA neg   POCT urinalysis dipstick  Result Value Ref Range   Color, UA yellow    Clarity, UA clear    Glucose, UA neg    Bilirubin, UA neg    Ketones, UA neg    Spec Grav, UA 1.015    Blood, UA neg    pH, UA 7.0    Protein, UA neg    Urobilinogen, UA 0.2    Nitrite, UA neg    Leukocytes, UA small (1+) (A) Negative  POCT glucose (manual entry)  Result Value Ref Range   POC Glucose 106 (A) 70 - 99 mg/dl     EKG/XRAY:   Primary read interpreted by Dr. Marin Comment at [facility].   ASSESSMENT/PLAN: Encounter Diagnoses  Name Primary?  . Other fatigue   . Dizziness and giddiness   . Weight loss   . Seasonal allergies   . Acute UTI Yes   Push fluids Cont with Allergy meds otc NSAIDs prn for neck sprain and strain Labs pending Rx keflex given, urine cx pending.    Gross sideeffects, risk and benefits, and alternatives of medications d/w patient. Patient is aware that all medications have potential sideeffects and we are unable to predict every sideeffect or drug-drug interaction that may occur.  , [place], DO 01/26/2015 8:02 AM

## 2015-01-22 LAB — URINE CULTURE
Colony Count: NO GROWTH
Organism ID, Bacteria: NO GROWTH

## 2015-01-22 LAB — VITAMIN D 25 HYDROXY (VIT D DEFICIENCY, FRACTURES): Vit D, 25-Hydroxy: 19 ng/mL — ABNORMAL LOW (ref 30–100)

## 2015-01-29 ENCOUNTER — Telehealth: Payer: Self-pay

## 2015-01-29 NOTE — Telephone Encounter (Signed)
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

## 2015-01-29 NOTE — Telephone Encounter (Signed)
Pt has questions her medication that she was only given 14 pills and she has taken those and symptoms have not gone away  Please call pt before mid day she works third shift and getting ready to go to be soon

## 2015-01-29 NOTE — Telephone Encounter (Signed)
Patient called again about her previous message. It was marked as high priority previously. Please advise.   [phone]

## 2015-01-30 NOTE — Telephone Encounter (Signed)
Spoke with patient and dw her labs. Sh eis doing somewhat better with vaginal dc, she is feeling the same about tiredness, She does not sleep well. Will come in tomorrow to see me for vaginal exam since urine cx was negative. She states she has "inverted cervix"

## 2015-01-30 NOTE — Telephone Encounter (Signed)
Spoke with pt, she states the medication did not help with her UTI. She states the pharmacist was surprised that she was only given 14 pills. She is still having pain and now back pain. SHe would like another ABX and possibly something for pain. Pt states she has to work and her job requires her to stand up. Please advise.

## 2015-01-31 ENCOUNTER — Telehealth: Payer: Self-pay

## 2015-01-31 ENCOUNTER — Ambulatory Visit (INDEPENDENT_AMBULATORY_CARE_PROVIDER_SITE_OTHER): Payer: BLUE CROSS/BLUE SHIELD | Admitting: Family Medicine

## 2015-01-31 VITALS — BP 100/60 | HR 72 | Temp 97.6°F | Resp 15 | Ht 65.0 in | Wt 139.0 lb

## 2015-01-31 DIAGNOSIS — N898 Other specified noninflammatory disorders of vagina: Secondary | ICD-10-CM

## 2015-01-31 DIAGNOSIS — E559 Vitamin D deficiency, unspecified: Secondary | ICD-10-CM | POA: Diagnosis not present

## 2015-01-31 DIAGNOSIS — G4700 Insomnia, unspecified: Secondary | ICD-10-CM

## 2015-01-31 DIAGNOSIS — M545 Low back pain, unspecified: Secondary | ICD-10-CM

## 2015-01-31 LAB — POCT WET PREP WITH KOH
Clue Cells Wet Prep HPF POC: NEGATIVE
KOH Prep POC: NEGATIVE
Trichomonas, UA: NEGATIVE
Yeast Wet Prep HPF POC: NEGATIVE

## 2015-01-31 MED ORDER — METRONIDAZOLE 0.75 % VA GEL
1.0000 | Freq: Two times a day (BID) | VAGINAL | Status: DC
Start: 2015-01-31 — End: 2016-08-08

## 2015-01-31 MED ORDER — CYCLOBENZAPRINE HCL 5 MG PO TABS: ORAL_TABLET | ORAL | Status: DC

## 2015-01-31 MED ORDER — VITAMIN D (ERGOCALCIFEROL) 1.25 MG (50000 UNIT) PO CAPS: 50000.0000 [IU] | ORAL_CAPSULE | ORAL | Status: DC

## 2015-01-31 NOTE — Telephone Encounter (Signed)
Error

## 2015-01-31 NOTE — Progress Notes (Signed)
Chief Complaint:  Chief Complaint  Patient presents with  . Follow-up    Pain in back and buttocks  . Vaginal Discharge    Onset 4 months    HPI: Teresa Orr is a 59 y.o. female who is here for  *vague cmplaints.  She is still having a hard time sleeping, she is not doing well on the 3rd shift. She has pains all over.  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 She is not used to this She had a recent pap and that was normal She has one partner , they do not live together but have been together for a long time She has difficulty sleeping, going to sleep and maintaining sleep  Last OV from 6/27  Teresa Orr is a 59 y.o. female who is here for 1 month dizziness, headache, neck stays stiff and she has allergies. She feels sick to her stomach but not anuseated. She is on 3rd shift. She is not eating , she started third shift because she thought she was downsizing and being proactive by switching to another job but it is at a different time. She is . Her company did not downsize but keep sending people home early. 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   Yesterday she had a frontal HA and took ibuprofen and helped but lasted all day, not the worst of her life, 7 out of 10 diffuse temporal pain , no eye pain, ascribed as a aching pain. No nause or vomiting. But feels stomach "is not feeling good". Not drinking well. No CP SOB or palpitations. Denies any diaphoresis.   She also has had some stomach upsets/cramps , growling. No diarrhea, no constipation, no new foods, meds, travels, no sick contacts.  When her allergies flaire up feels she has the tingles in her sinuses.. Her face feels numb and tingling. Not new for her. The stomacha  nd dizziness and weakness is new. She takes zyrtec D   Past Medical History  Diagnosis Date  . Anxiety   . GERD (gastroesophageal reflux disease)   . Status post dilation of esophageal narrowing   . Sickle cell trait    Past Surgical History  Procedure Laterality Date  . Cesarean section    . Colonoscopy     History   Social History  . Marital Status: Single    Spouse Name: N/A  . Number of Children: 3  . Years of Education: N/A   Occupational History  .     Social History Main Topics  . Smoking status: Never Smoker   . Smokeless tobacco: Never Used  . Alcohol Use: 1.2 oz/week    2 Standard drinks or equivalent per week     Comment: 1 per day  . Drug Use: No  . Sexual Activity: Not on file   Other Topics Concern  . None   Social History Narrative   Family History  Problem Relation Age of Onset  . Adopted: Yes  . Diabetes Other     10 out of 13 siblings  . Lung cancer Mother   . Cancer Mother   . Diabetes Mother   . Hypertension Other  siblings  . Diabetes Sister   . Hyperlipidemia Sister   . Diabetes Brother   . Hyperlipidemia Brother    Allergies  Allergen Reactions  . Esomeprazole Magnesium   . Hydrocod Polst-Cpm Polst Er   . Nexium [Esomeprazole Magnesium]    Prior to Admission medications   Medication Sig Start Date End Date Taking? Authorizing Provider  cetirizine-pseudoephedrine (ZYRTEC-D) 5-120 MG per tablet Take 1 tablet by mouth as needed for allergies.   Yes Historical Provider, MD  Cimetidine (TAGAMET PO) Take 1 tablet by mouth daily.   Yes Historical Provider, MD  diphenhydrAMINE (BENADRYL) 25 MG tablet Take 25 mg by mouth every 6 (six) hours as needed.   Yes Historical Provider, MD  traZODone (DESYREL) 50 MG tablet Take 50 mg by mouth at bedtime.   Yes Historical Provider, MD  cyclobenzaprine (FLEXERIL) 5 MG tablet Take 1/2-1 tab po qhs prn 01/31/15   Stokes Rattigan P Sherrika Weakland, DO  metroNIDAZOLE (METROGEL) 0.75 % vaginal gel Place 1 Applicatorful  vaginally 2 (two) times daily. For 7 days 01/31/15   Domino Holten P Delania Ferg, DO  Vitamin D, Ergocalciferol, (DRISDOL) 50000 UNITS CAPS capsule Take 1 capsule (50,000 Units total) by mouth every 7 (seven) days. 01/31/15   Vesna Kable P Jadis Pitter, DO     ROS: The patient denies fevers, chills, night sweats, unintentional weight loss, chest pain, palpitations, wheezing, dyspnea on exertion, nausea, vomiting, abdominal pain, dysuria, hematuria, melena, numbness, weakness, or tingling.   All other systems have been reviewed and were otherwise negative with the exception of those mentioned in the HPI and as above.    PHYSICAL EXAM: Filed Vitals:   01/31/15 0901  BP: 100/60  Pulse: 72  Temp: 97.6 F (36.4 C)  Resp: 15   Filed Vitals:   01/31/15 0901  Height: 5\' 5"  (1.651 m)  Weight: 139 lb (63.05 kg)   Body mass index is 23.13 kg/(m^2).   General: Alert, no acute distress HEENT:  Normocephalic, atraumatic, oropharynx patent. EOMI, PERRLA Cardiovascular:  Regular rate and rhythm, no rubs murmurs or gallops.  No Carotid bruits, radial pulse intact. No pedal edema.  Respiratory: Clear to auscultation bilaterally.  No wheezes, rales, or rhonchi.  No cyanosis, no use of accessory musculature GI: No organomegaly, abdomen is soft and non-tender, positive bowel sounds.  No masses. Skin: No rashes. Neurologic: Facial musculature symmetric. Psychiatric: Patient is appropriate throughout our interaction. Lymphatic: No cervical lymphadenopathy Musculoskeletal: Gait intact. + paramsk tenderness  Full ROM 5/5 strength, 2/2 DTRs No saddle anesthesia Straight leg negative Hip and knee exam--normal  Gu-normal, no appreciable dc except waht is normal physiological dc, no masses cervix nl   LABS: Results for orders placed or performed in visit on 01/31/15  POCT Wet Prep with KOH  Result Value Ref Range   Trichomonas, UA Negative    Clue Cells Wet Prep HPF POC neg    Epithelial Wet Prep HPF POC Few Few, Moderate, Many    Yeast Wet Prep HPF POC neg    Bacteria Wet Prep HPF POC Few Few   RBC Wet Prep HPF POC 0-1    WBC Wet Prep HPF POC 5-10    KOH Prep POC Negative      EKG/XRAY:   Primary read interpreted by Dr. Marin Comment at [facility].   ASSESSMENT/PLAN: Encounter Diagnoses  Name Primary?  . Vaginal discharge Yes  . Vitamin D deficiency   . Insomnia   . Right-sided low back pain without sciatica    Vit D def so  rx vit D weekly Rx Diflucan for sxs but nothing objective on wet prep Rx Flexeril may help with insomnia also also msk aches. Decline xrays since [place] [employer] note written Fu in 3 month or prn  Gross sideeffects, risk and benefits, and alternatives of medications d/w patient. Patient is aware that all medications have potential sideeffects and we are unable to predict every sideeffect or drug-drug interaction that may occur.  Dr. Tilden Fossa Marin Comment 01/31/2015 11:13 AM

## 2015-01-31 NOTE — Patient Instructions (Signed)
Back Exercises  Back exercises help treat and prevent back injuries. The goal of back exercises is to increase the strength of your abdominal and back muscles and the flexibility of your back. These exercises should be started when you no longer have back pain. Back exercises include:  · Pelvic Tilt. Lie on your back with your knees bent. Tilt your pelvis until the lower part of your back is against the floor. Hold this position 5 to 10 sec and repeat 5 to 10 times.  · Knee to Chest. Pull first 1 knee up against your chest and hold for 20 to 30 seconds, repeat this with the other knee, and then both knees. This may be done with the other leg straight or bent, whichever feels better.  · Sit-Ups or Curl-Ups. Bend your knees 90 degrees. Start with tilting your pelvis, and do a partial, slow sit-up, lifting your trunk only 30 to 45 degrees off the floor. Take at least 2 to 3 seconds for each sit-up. Do not do sit-ups with your knees out straight. If partial sit-ups are difficult, simply do the above but with only tightening your abdominal muscles and holding it as directed.  · Hip-Lift. Lie on your back with your knees flexed 90 degrees. Push down with your feet and shoulders as you raise your hips a couple inches off the floor; hold for 10 seconds, repeat 5 to 10 times.  · Back arches. Lie on your stomach, propping yourself up on bent elbows. Slowly press on your hands, causing an arch in your low back. Repeat 3 to 5 times. Any initial stiffness and discomfort should lessen with repetition over time.  · Shoulder-Lifts. Lie face down with arms beside your body. Keep hips and torso pressed to floor as you slowly lift your head and shoulders off the floor.  Do not overdo your exercises, especially in the beginning. Exercises may cause you some mild back discomfort which lasts for a few minutes; however, if the pain is more severe, or lasts for more than 15 minutes, do not continue exercises until you see your caregiver.  Improvement with exercise therapy for back problems is slow.   See your caregivers for assistance with developing a proper back exercise program.  Document Released: 08/20/2004 Document Revised: 10/05/2011 Document Reviewed: 05/14/2011  ExitCare® Patient Information ©2015 [employer]. This information is not intended to replace advice given to you by your health care provider. Make sure you discuss any questions you have with your health care provider.

## 2015-02-01 LAB — GC/CHLAMYDIA PROBE AMP
CT Probe RNA: NEGATIVE
GC Probe RNA: NEGATIVE

## 2015-04-13 ENCOUNTER — Telehealth: Payer: Self-pay | Admitting: Family Medicine

## 2015-04-13 DIAGNOSIS — Z131 Encounter for screening for diabetes mellitus: Secondary | ICD-10-CM

## 2015-04-13 DIAGNOSIS — Z13220 Encounter for screening for lipoid disorders: Secondary | ICD-10-CM

## 2015-04-13 NOTE — Telephone Encounter (Signed)
Pt needs biometric screening results faxed/Emailed to [phone] for reduction in insurance premiums; please contact when complete. Documents left in provider's mailbox.  [phone]

## 2015-04-17 NOTE — Telephone Encounter (Signed)
Please call her back and tell her she can pick it up now if she wants.

## 2015-04-17 NOTE — Telephone Encounter (Signed)
Here Teresa Orr.

## 2015-04-17 NOTE — Telephone Encounter (Signed)
I have asked Rodi to call her to let her know that we did get blood, but she did not come in for biometeric blood work, it was not her chief complaint. Patient did not understand from what I was told that this was the issue.   I looked at her chart again and she  Does not have the required blood worl. She needs to come back to get the required blood work,I have put in a  labs only visit which I have put in future orders for her so we can complete the forms in a timely manner or she can come in and get checked in for a physical with fasting blood work. Her form is upfront int he drawer.

## 2015-04-17 NOTE — Telephone Encounter (Signed)
Patient called to check the status of her forms. She states that they are to be faxed by 04/26/2015. Has anyone seen this paperwork?

## 2015-04-20 ENCOUNTER — Other Ambulatory Visit (INDEPENDENT_AMBULATORY_CARE_PROVIDER_SITE_OTHER): Payer: BLUE CROSS/BLUE SHIELD | Admitting: *Deleted

## 2015-04-20 DIAGNOSIS — Z131 Encounter for screening for diabetes mellitus: Secondary | ICD-10-CM

## 2015-04-20 DIAGNOSIS — Z13220 Encounter for screening for lipoid disorders: Secondary | ICD-10-CM | POA: Diagnosis not present

## 2015-04-20 LAB — LIPID PANEL
Cholesterol: 129 mg/dL (ref 125–200)
HDL: 68 mg/dL (ref >=46)
LDL Cholesterol: 50 mg/dL (ref <130)
Total CHOL/HDL Ratio: 1.9 ratio (ref <=5.0)
Triglycerides: 54 mg/dL (ref <150)
VLDL: 11 mg/dL (ref <30)

## 2015-04-20 LAB — GLUCOSE, RANDOM: Glucose, Bld: 99 mg/dL (ref 65–99)

## 2015-04-23 ENCOUNTER — Telehealth: Payer: Self-pay | Admitting: *Deleted

## 2015-04-23 NOTE — Telephone Encounter (Signed)
error 

## 2015-04-23 NOTE — Telephone Encounter (Signed)
Patient called for lab results for her form. She states you have the form and wants the results faxed to the number on the form. Patients number is [phone]

## 2015-04-23 NOTE — Telephone Encounter (Signed)
I  had sent her a mychart question with all her lab results, she did not check it. Need waist circumference by tonight otherwis will fax what I have. Labs are normal.

## 2015-04-23 NOTE — Telephone Encounter (Signed)
Error

## 2015-06-15 ENCOUNTER — Ambulatory Visit (INDEPENDENT_AMBULATORY_CARE_PROVIDER_SITE_OTHER): Payer: BLUE CROSS/BLUE SHIELD | Admitting: Family Medicine

## 2015-06-15 VITALS — BP 136/80 | HR 92 | Temp 97.8°F | Resp 16 | Ht 65.0 in | Wt 142.0 lb

## 2015-06-15 DIAGNOSIS — K219 Gastro-esophageal reflux disease without esophagitis: Secondary | ICD-10-CM

## 2015-06-15 DIAGNOSIS — J0101 Acute recurrent maxillary sinusitis: Secondary | ICD-10-CM

## 2015-06-15 DIAGNOSIS — J302 Other seasonal allergic rhinitis: Secondary | ICD-10-CM

## 2015-06-15 MED ORDER — AMOXICILLIN 500 MG PO TABS: 1000.0000 mg | ORAL_TABLET | Freq: Two times a day (BID) | ORAL | Status: DC

## 2015-06-15 MED ORDER — PREDNISONE 20 MG PO TABS
ORAL_TABLET | ORAL | Status: DC
Start: 2015-06-15 — End: 2016-08-08

## 2015-06-15 NOTE — Progress Notes (Signed)
Subjective:    Patient ID: Teresa Orr, female    DOB: November 10, 1955, 59 y.o.   MRN: [id number]  06/15/2015  Sinus Pressure   HPI This 59 y.o. female presents for evaluation of sinus pressure for one month.  Progressive worsening sinus pressure.  Also having headaches.    No fever but +chills. +HA.  No rhinorrhea; feels sinus pressure; Zyrtec is drying out; Mucinex is not effective.  Nettie pot but has not used.  Does have Flonase OTC and using that.  No pressure. +PND.  +coughing.  Swollen glands.  Must wear ear plugs all the time; ears popping.  Wears ear plugs.  Abdominal pain: no nausea, vomiting, diarrhea; stomach is off; stomach is not right; onset two days ago; normal b.m. Daily.  No diarrhea.  No straining; no bloody stools.  Feels gassy; takes Tagamet PRN; now much improved.  No dysuria, urgency, hematuria, frequency.   Review of Systems  Constitutional: Positive for chills. Negative for fever, diaphoresis and fatigue.  HENT: Positive for sinus pressure. Negative for congestion, ear discharge, ear pain, postnasal drip, rhinorrhea, sore throat, trouble swallowing and voice change.   Respiratory: Positive for cough. Negative for shortness of breath.   Cardiovascular: Negative for chest pain, palpitations and leg swelling.  Gastrointestinal: Positive for abdominal distention. Negative for nausea, vomiting, abdominal pain, diarrhea, constipation, blood in stool, anal bleeding and rectal pain.       +gassy  Genitourinary: Negative for dysuria, urgency, frequency and hematuria.    Past Medical History  Diagnosis Date  . Anxiety   . GERD (gastroesophageal reflux disease)   . Status post dilation of esophageal narrowing   . Sickle cell trait [facility])    Past Surgical History  Procedure Laterality Date  . Cesarean section    . Colonoscopy     Allergies  Allergen Reactions  . Esomeprazole Magnesium   . Hydrocod Polst-Cpm Polst Er   . Nexium [Esomeprazole Magnesium]     Social  History   Social History  . Marital Status: Single    Spouse Name: N/A  . Number of Children: 3  . Years of Education: N/A   Occupational History  .     Social History Main Topics  . Smoking status: Never Smoker   . Smokeless tobacco: Never Used  . Alcohol Use: 1.2 oz/week    2 Standard drinks or equivalent per week     Comment: 1 per day  . Drug Use: No  . Sexual Activity: Not on file   Other Topics Concern  . Not on file   Social History Narrative   Family History  Problem Relation Age of Onset  . Adopted: Yes  . Diabetes Other     10 out of 13 siblings  . Lung cancer Mother   . Cancer Mother   . Diabetes Mother   . Hypertension Other     siblings  . Diabetes Sister   . Hyperlipidemia Sister   . Diabetes Brother   . Hyperlipidemia Brother        Objective:    BP 136/80 mmHg  Pulse 92  Temp(Src) 97.8 F (36.6 C) (Oral)  Resp 16  Ht 5\' 5"  (1.651 m)  Wt 142 lb (64.411 kg)  BMI 23.63 kg/m2  SpO2 98% Physical Exam  Constitutional: She is oriented to person, place, and time. She appears well-developed and well-nourished. No distress.  HENT:  Head: Normocephalic and atraumatic.  Right Ear: External ear normal.  Left Ear: External ear  normal.  Nose: Mucosal edema present. No rhinorrhea. Right sinus exhibits maxillary sinus tenderness. Right sinus exhibits no frontal sinus tenderness. Left sinus exhibits maxillary sinus tenderness. Left sinus exhibits no frontal sinus tenderness.  Mouth/Throat: Uvula is midline, oropharynx is clear and moist and mucous membranes are normal. No oropharyngeal exudate, posterior oropharyngeal edema or posterior oropharyngeal erythema.  Eyes: Conjunctivae are normal. Pupils are equal, round, and reactive to light.  Neck: Normal range of motion. Neck supple.  Cardiovascular: Normal rate, regular rhythm and normal heart sounds.  Exam reveals no gallop and no friction rub.   No murmur heard. Pulmonary/Chest: Effort normal and breath  sounds normal. She has no wheezes. She has no rales.  Abdominal: Soft. Bowel sounds are normal. She exhibits no distension and no mass. There is no tenderness. There is no rebound and no guarding.  Neurological: She is alert and oriented to person, place, and time.  Skin: Skin is warm and dry. She is not diaphoretic.  Psychiatric: She has a normal mood and affect. Her behavior is normal.  Nursing note and vitals reviewed.      Assessment & Plan:   1. Acute recurrent maxillary sinusitis   2. Other seasonal allergic rhinitis   3. Gastroesophageal reflux disease without esophagitis     1. Acute recurrent maxillary sinusitis with allergic rhinitis:  New/recurrent; rx for Amoxicillin and Prednisone provided.  Start [employer] daily; continue [facility]. 2.  GERD/gas: New. Restart Tagamet.   No orders of the defined types were placed in this encounter.   Meds ordered this encounter  Medications  . amoxicillin (AMOXIL) 500 MG tablet    Sig: Take 2 tablets (1,000 mg total) by mouth 2 (two) times daily.    Dispense:  40 tablet    Refill:  0  . predniSONE (DELTASONE) 20 MG tablet    Sig: Two tablets daily x 5 days then one tablet for days    Dispense:  15 tablet    Refill:  0    No Follow-up on file.    Ismaeel Arvelo Elayne Guerin, M.D. [facility] [address] [employer] [place], [place]  [address] [phone] phone [phone] fax

## 2015-06-15 NOTE — Patient Instructions (Signed)
1.  Restart Tagamet. 2.  Restart Netttie Pot daily. 3.  Continue Flonase daily. 4.  Start Amoxicillin twice daily.  Sinusitis, Adult Sinusitis is redness, soreness, and inflammation of the paranasal sinuses. Paranasal sinuses are air pockets within the bones of your face. They are located beneath your eyes, in the middle of your forehead, and above your eyes. In healthy paranasal sinuses, mucus is able to drain out, and air is able to circulate through them by way of your nose. However, when your paranasal sinuses are inflamed, mucus and air can become trapped. This can allow bacteria and other germs to grow and cause infection. Sinusitis can develop quickly and last only a short time (acute) or continue over a long period (chronic). Sinusitis that lasts for more than 12 weeks is considered chronic. CAUSES Causes of sinusitis include:  Allergies.  Structural abnormalities, such as displacement of the cartilage that separates your nostrils (deviated septum), which can decrease the air flow through your nose and sinuses and affect sinus drainage.  Functional abnormalities, such as when the small hairs (cilia) that line your sinuses and help remove mucus do not work properly or are not present. SIGNS AND SYMPTOMS Symptoms of acute and chronic sinusitis are the same. The primary symptoms are pain and pressure around the affected sinuses. Other symptoms include:  Upper toothache.  Earache.  Headache.  Bad breath.  Decreased sense of smell and taste.  A cough, which worsens when you are lying flat.  Fatigue.  Fever.  Thick drainage from your nose, which often is green and may contain pus (purulent).  Swelling and warmth over the affected sinuses. DIAGNOSIS Your health care provider will perform a physical exam. During your exam, your health care provider may perform any of the following to help determine if you have acute sinusitis or chronic sinusitis:  Look in your nose for signs  of abnormal growths in your nostrils (nasal polyps).  Tap over the affected sinus to check for signs of infection.  View the inside of your sinuses using an imaging device that has a light attached (endoscope). If your health care provider suspects that you have chronic sinusitis, one or more of the following tests may be recommended:  Allergy tests.  Nasal culture. A sample of mucus is taken from your nose, sent to a lab, and screened for bacteria.  Nasal cytology. A sample of mucus is taken from your nose and examined by your health care provider to determine if your sinusitis is related to an allergy. TREATMENT Most cases of acute sinusitis are related to a viral infection and will resolve on their own within 10 days. Sometimes, medicines are prescribed to help relieve symptoms of both acute and chronic sinusitis. These may include pain medicines, decongestants, nasal steroid sprays, or saline sprays. However, for sinusitis related to a bacterial infection, your health care provider will prescribe antibiotic medicines. These are medicines that will help kill the bacteria causing the infection. Rarely, sinusitis is caused by a fungal infection. In these cases, your health care provider will prescribe antifungal medicine. For some cases of chronic sinusitis, surgery is needed. Generally, these are cases in which sinusitis recurs more than 3 times per year, despite other treatments. HOME CARE INSTRUCTIONS  Drink plenty of water. Water helps thin the mucus so your sinuses can drain more easily.  Use a humidifier.  Inhale steam 3-4 times a day (for example, sit in the bathroom with the shower running).  Apply a warm, moist washcloth  to your face 3-4 times a day, or as directed by your health care provider.  Use saline nasal sprays to help moisten and clean your sinuses.  Take medicines only as directed by your health care provider.  If you were prescribed either an antibiotic or  antifungal medicine, finish it all even if you start to feel better. SEEK IMMEDIATE MEDICAL CARE IF:  You have increasing pain or severe headaches.  You have nausea, vomiting, or drowsiness.  You have swelling around your face.  You have vision problems.  You have a stiff neck.  You have difficulty breathing.   This information is not intended to replace advice given to you by your health care provider. Make sure you discuss any questions you have with your health care provider.   Document Released: 07/13/2005 Document Revised: 08/03/2014 Document Reviewed: 07/28/2011 Elsevier Interactive Patient Education [employer].

## 2015-09-19 ENCOUNTER — Emergency Department (HOSPITAL_COMMUNITY)
Admission: EM | Admit: 2015-09-19 | Discharge: 2015-09-19 | Disposition: A | Discharge status: Home / Self Care | Payer: BLUE CROSS/BLUE SHIELD | Attending: Emergency Medicine | Admitting: Emergency Medicine

## 2015-09-19 ENCOUNTER — Encounter (HOSPITAL_COMMUNITY): Payer: Self-pay | Admitting: Emergency Medicine

## 2015-09-19 DIAGNOSIS — R0981 Nasal congestion: Secondary | ICD-10-CM

## 2015-09-19 DIAGNOSIS — Z8719 Personal history of other diseases of the digestive system: Secondary | ICD-10-CM | POA: Insufficient documentation

## 2015-09-19 DIAGNOSIS — J3489 Other specified disorders of nose and nasal sinuses: Secondary | ICD-10-CM | POA: Diagnosis not present

## 2015-09-19 DIAGNOSIS — Z792 Long term (current) use of antibiotics: Secondary | ICD-10-CM | POA: Insufficient documentation

## 2015-09-19 DIAGNOSIS — Z862 Personal history of diseases of the blood and blood-forming organs and certain disorders involving the immune mechanism: Secondary | ICD-10-CM | POA: Diagnosis not present

## 2015-09-19 DIAGNOSIS — F419 Anxiety disorder, unspecified: Secondary | ICD-10-CM | POA: Diagnosis not present

## 2015-09-19 DIAGNOSIS — J029 Acute pharyngitis, unspecified: Secondary | ICD-10-CM | POA: Diagnosis present

## 2015-09-19 LAB — RAPID STREP SCREEN (MED CTR MEBANE ONLY): Streptococcus, Group A Screen (Direct): NEGATIVE

## 2015-09-19 MED ORDER — IBUPROFEN 800 MG PO TABS
800.0000 mg | ORAL_TABLET | Freq: Once | ORAL | Status: DC
Start: 2015-09-19 — End: 2015-09-19
  Filled 2015-09-19: qty 1

## 2015-09-19 MED ORDER — CETIRIZINE HCL 10 MG PO TABS: 10.0000 mg | ORAL_TABLET | Freq: Every day | ORAL | Status: DC

## 2015-09-19 MED ORDER — PREDNISONE 20 MG PO TABS: 40.0000 mg | ORAL_TABLET | Freq: Every day | ORAL | Status: DC

## 2015-09-19 MED ORDER — DEXAMETHASONE SODIUM PHOSPHATE 10 MG/ML IJ SOLN
10.0000 mg | Freq: Once | INTRAMUSCULAR | Status: DC
  Filled 2015-09-19: qty 1

## 2015-09-19 MED ORDER — METHOCARBAMOL 500 MG PO TABS: 500.0000 mg | ORAL_TABLET | Freq: Two times a day (BID) | ORAL | Status: DC

## 2015-09-19 NOTE — ED Notes (Signed)
Pt asking to leave before strep test is resolved. Needing to go to work.

## 2015-09-19 NOTE — Discharge Instructions (Signed)
You were seen in the ER today for evaluation of sore throat, sinus congestion, and neck pain. As we discussed I suspect your symptoms might be due to post nasal drip, allergies, or a virus. I do not think you have a bacterial infection of your throat or sinuses. However, per your request we are doing a strep test for your throat. We will call you if it is positive. I also gave you a few prescriptions to try to alleviate your symptoms. You may also take ibuprofen 800mg  three times daily for the pain. If your symptoms persist, please follow up with ENT (contact info attached). Please also follow up with your primary care provider within one week. Return to the ER for new or worsening symptoms.

## 2015-09-19 NOTE — ED Provider Notes (Signed)
CSN: [id number]     Arrival date & time 09/19/15  [id number] History   First MD Initiated Contact with Patient 09/19/15 1006     Chief Complaint  Patient presents with  . Sore Throat    HPI   [name] is an 60 y.o. female with history of anxiety, GERD who presents to the ED for evaluation of sore throat, sinus congestion, and neck pain. She states her symptoms began almost two months ago. She states that for "a while" she has noticed slowly increasing nasal/sinus congestion and intermittent throat irritation. States that last month it got noticeably worse and she went to see ENT for evaluation. She was apparently told she had a normal exam and prescribed Augmentin and tylenol. She states she did not finish the Augmentin as it gave her diarrhea. She states she is here today because she wants a throat culture. She states she frequently had strep throat growing up and wants her throat cultured today. She states that she has also tried flonase with some relief of her congestion, though still feels her throat is irritated. She states her throat looks red at home in the mirror. She denies fever, chills. Denies dysphagia, trismus, drooling. Denies neck stiffness, though states the left side of her neck feels painful and tight at times. She states she used to take Zyrtec for allergies which also seemed to help. Denies cough. Denies chest pain or SOB.   Past Medical History  Diagnosis Date  . Anxiety   . GERD (gastroesophageal reflux disease)   . Status post dilation of esophageal narrowing   . Sickle cell trait [facility])    Past Surgical History  Procedure Laterality Date  . Cesarean section    . Colonoscopy     Family History  Problem Relation Age of Onset  . Adopted: Yes  . Diabetes Other     10 out of 13 siblings  . Lung cancer Mother   . Cancer Mother   . Diabetes Mother   . Hypertension Other     siblings  . Diabetes Sister   . Hyperlipidemia Sister   . Diabetes Brother   . Hyperlipidemia  Brother    Social History  Substance Use Topics  . Smoking status: Never Smoker   . Smokeless tobacco: Never Used  . Alcohol Use: 1.2 oz/week    2 Standard drinks or equivalent per week     Comment: 1 per day   OB History    No data available     Review of Systems  All other systems reviewed and are negative.     Allergies  Esomeprazole magnesium; Hydrocod polst-cpm polst er; and Nexium  Home Medications   Prior to Admission medications   Medication Sig Start Date End Date Taking? Authorizing Provider  amoxicillin (AMOXIL) 500 MG tablet Take 2 tablets (1,000 mg total) by mouth 2 (two) times daily. 06/15/15   Wardell Honour, MD  cetirizine-pseudoephedrine (ZYRTEC-D) 5-120 MG per tablet Take 1 tablet by mouth as needed for allergies.    Historical Provider, MD  Cimetidine (TAGAMET PO) Take 1 tablet by mouth daily.    Historical Provider, MD  cyclobenzaprine (FLEXERIL) 5 MG tablet Take 1/2-1 tab po qhs prn Patient not taking: Reported on 06/15/2015 01/31/15   Thao P Le, DO  diphenhydrAMINE (BENADRYL) 25 MG tablet Take 25 mg by mouth every 6 (six) hours as needed.    Historical Provider, MD  metroNIDAZOLE (METROGEL) 0.75 % vaginal gel Place 1 Applicatorful vaginally 2 (  two) times daily. For 7 days Patient not taking: Reported on 06/15/2015 01/31/15   Thao P Le, DO  predniSONE (DELTASONE) 20 MG tablet Two tablets daily x 5 days then one tablet for days 06/15/15   Wardell Honour, MD  traZODone (DESYREL) 50 MG tablet Take 50 mg by mouth at bedtime.    Historical Provider, MD  Vitamin D, Ergocalciferol, (DRISDOL) 50000 UNITS CAPS capsule Take 1 capsule (50,000 Units total) by mouth every 7 (seven) days. Patient not taking: Reported on 06/15/2015 01/31/15   Thao P Le, DO   BP 136/81 mmHg  Pulse 78  Temp(Src) 97.7 F (36.5 C) (Oral)  Resp 16  SpO2 100% Physical Exam  Constitutional: She is oriented to person, place, and time. No distress.  HENT:  Head: Atraumatic.  Right Ear:  Tympanic membrane and external ear normal.  Left Ear: Tympanic membrane and external ear normal.  Nose: Nose normal.  Mild posterior oropharyngeal erythema and cobblestoning. No tonsillar or posterior oropharyngeal edema. No tonsillar exudate. There is one white spot on left tonsil that is likely impacted food/tonsilolith. Uvula midline.  Eyes: Conjunctivae are normal. No scleral icterus.  Neck: Neck supple.  No cervical LAD. No c-spine or paraspinal tenderness. FROM with no pain.   Cardiovascular: Normal rate and regular rhythm.   Pulmonary/Chest: Effort normal. No respiratory distress. She exhibits no tenderness.  Abdominal: Soft. She exhibits no distension. There is no tenderness.  Neurological: She is alert and oriented to person, place, and time.  Skin: Skin is warm and dry. She is not diaphoretic.  Psychiatric: Her behavior is normal. Her mood appears anxious.  Nursing note and vitals reviewed.   ED Course  Procedures (including critical care time) Labs Review Labs Reviewed  RAPID STREP SCREEN (NOT AT [facility])  CULTURE, GROUP A STREP [facility])    Imaging Review No results found. I have personally reviewed and evaluated these images and lab results as part of my medical decision-making.   EKG Interpretation None      MDM   Final diagnoses:  Sore throat  Sinus congestion    I discussed with pt at this time my ddx includes viral vs allergic sinusitis/pharyngitis, post nasal drip, GERD. Pt afebrile with nonfocal exam. Given low suspicion for bacterial infection I think it is reasonable that pt do not continue her Augmentin rx. Per pt's request, rapid strep/GAS culture obtained. I have low suspicion for strep pharyngitis at this time. Rx given for robaxin, prednisone, and zyrtec. Pt has flonase at home which I encouraged her to continue using PRN. Instructed her to take ibuprofen as needed as well. Referral to ENT given for f/u if symptoms persist. Instructed to f/u with PCP. ER  return precautions given.     Anne Ng, PA-C 09/19/15 1129  Daleen Bo, MD 09/19/15 1739

## 2015-09-19 NOTE — ED Notes (Signed)
Pt c/o sore throat, sinus congestion, swollen gland to left side, blister to left posterior throat, throbbing sensation down into left shoulder x 1 month. States she has been seen for the same.

## 2015-09-22 LAB — CULTURE, GROUP A STREP (THRC)

## 2015-12-26 ENCOUNTER — Ambulatory Visit: Payer: BLUE CROSS/BLUE SHIELD

## 2016-01-22 ENCOUNTER — Emergency Department (HOSPITAL_COMMUNITY)
Admission: EM | Admit: 2016-01-22 | Discharge: 2016-01-22 | Disposition: A | Discharge status: Home / Self Care | Payer: BLUE CROSS/BLUE SHIELD | Attending: Emergency Medicine | Admitting: Emergency Medicine

## 2016-01-22 ENCOUNTER — Emergency Department (HOSPITAL_COMMUNITY): Payer: BLUE CROSS/BLUE SHIELD

## 2016-01-22 ENCOUNTER — Encounter (HOSPITAL_COMMUNITY): Payer: Self-pay

## 2016-01-22 DIAGNOSIS — R531 Weakness: Secondary | ICD-10-CM | POA: Diagnosis not present

## 2016-01-22 DIAGNOSIS — Z79899 Other long term (current) drug therapy: Secondary | ICD-10-CM | POA: Diagnosis not present

## 2016-01-22 DIAGNOSIS — R200 Anesthesia of skin: Secondary | ICD-10-CM | POA: Insufficient documentation

## 2016-01-22 DIAGNOSIS — I6789 Other cerebrovascular disease: Secondary | ICD-10-CM | POA: Diagnosis not present

## 2016-01-22 DIAGNOSIS — G459 Transient cerebral ischemic attack, unspecified: Secondary | ICD-10-CM | POA: Diagnosis present

## 2016-01-22 DIAGNOSIS — R42 Dizziness and giddiness: Secondary | ICD-10-CM | POA: Diagnosis not present

## 2016-01-22 LAB — CBC WITH DIFFERENTIAL/PLATELET
Basophils Absolute: 0 10*3/uL (ref 0.0–0.1)
Basophils Relative: 0 %
EOS ABS: 0.1 10*3/uL (ref 0.0–0.7)
Eosinophils Relative: 2 %
HCT: 37.5 % (ref 36.0–46.0)
HEMOGLOBIN: 12.1 g/dL (ref 12.0–15.0)
LYMPHS ABS: 1.9 10*3/uL (ref 0.7–4.0)
Lymphocytes Relative: 52 %
MCH: 28 pg (ref 26.0–34.0)
MCHC: 32.3 g/dL (ref 30.0–36.0)
MCV: 86.8 fL (ref 78.0–100.0)
MONOS PCT: 6 %
Monocytes Absolute: 0.2 10*3/uL (ref 0.1–1.0)
NEUTROS PCT: 40 %
Neutro Abs: 1.5 10*3/uL — ABNORMAL LOW (ref 1.7–7.7)
Platelets: 197 10*3/uL (ref 150–400)
RBC: 4.32 MIL/uL (ref 3.87–5.11)
RDW: 12.7 % (ref 11.5–15.5)
WBC: 3.7 10*3/uL — ABNORMAL LOW (ref 4.0–10.5)

## 2016-01-22 LAB — BASIC METABOLIC PANEL
Anion gap: 6 (ref 5–15)
BUN: 10 mg/dL (ref 6–20)
CHLORIDE: 107 mmol/L (ref 101–111)
CO2: 27 mmol/L (ref 22–32)
CREATININE: 0.67 mg/dL (ref 0.44–1.00)
Calcium: 9.1 mg/dL (ref 8.9–10.3)
GFR calc Af Amer: >60 mL/min (ref >60)
GFR calc non Af Amer: >60 mL/min (ref >60)
GLUCOSE: 87 mg/dL (ref 65–99)
Potassium: 4 mmol/L (ref 3.5–5.1)
SODIUM: 140 mmol/L (ref 135–145)

## 2016-01-22 LAB — I-STAT TROPONIN, ED: TROPONIN I, POC: 0 ng/mL (ref 0.00–0.08)

## 2016-01-22 NOTE — ED Notes (Signed)
Patient transported to MRI 

## 2016-01-22 NOTE — Discharge Instructions (Signed)
Paresthesia Paresthesia is an abnormal burning or prickling sensation. This sensation is generally felt in the hands, arms, legs, or feet. However, it may occur in any part of the body. Usually, it is not painful. The feeling may be described as:  Tingling or numbness.  Pins and needles.  Skin crawling.  Buzzing.  Limbs falling asleep.  Itching. Most people experience temporary (transient) paresthesia at some time in their lives. Paresthesia may occur when you breathe too quickly (hyperventilation). It can also occur without any apparent cause. Commonly, paresthesia occurs when pressure is placed on a nerve. The sensation quickly goes away after the pressure is removed. For some people, however, paresthesia is a long-lasting (chronic) condition that is caused by an underlying disorder. If you continue to have paresthesia, you may need further medical evaluation. HOME CARE INSTRUCTIONS Watch your condition for any changes. Taking the following actions may help to lessen any discomfort that you are feeling:  Avoid drinking alcohol.  Try acupuncture or massage to help relieve your symptoms.  Keep all follow-up visits as directed by your health care provider. This is important. SEEK MEDICAL CARE IF:  You continue to have episodes of paresthesia.  Your burning or prickling feeling gets worse when you walk.  You have pain, cramps, or dizziness.  You develop a rash. SEEK IMMEDIATE MEDICAL CARE IF:  You feel weak.  You have trouble walking or moving.  You have problems with speech, understanding, or vision.  You feel confused.  You cannot control your bladder or bowel movements.  You have numbness after an injury.  You faint.   This information is not intended to replace advice given to you by your health care provider. Make sure you discuss any questions you have with your health care provider.   Document Released: 07/03/2002 Document Revised: 11/27/2014 Document Reviewed:  07/09/2014 Elsevier Interactive Patient Education 2016 [employer] Inc[employer]  

## 2016-01-22 NOTE — ED Provider Notes (Addendum)
CSN: [id number]     Arrival date & time 01/22/16  1611 History  By signing my name below, I, Teresa Orr, attest that this documentation has been prepared under the direction and in the presence of Blanchie Dessert, MD . Electronically Signed: Rowan Orr, Scribe. 01/22/2016. 4:35 PM.   Chief Complaint  Patient presents with  . Transient Ischemic Attack    The history is provided by the patient. No language interpreter was used.   HPI Comments:  Teresa Orr is a 60 y.o. female with PMHx of anxiety and GERD who presents to the Emergency Department via EMS complaining of sudden onset light-headedness and weakness. Pt had been sitting in a meeting then stood up to leave. Around 6-7 minutes later, pt's coworker noticed pt's left eye was drooping. Pt experienced subsequent light-headedness, weakness in both legs and the sensation her heart was racing. She sat down and symptoms gradually subsided. Pt currently feels mildly weak and light-headed. Pt reports eating breakfast and doesn't think she was dehydrated. LNMP 9 years ago. She notes FHx of DM, HTN and cancer. Pt denies h/o smoking and uses ETOH socially. Denies nausea, vision changes, any pain, swelling or weakness in one leg, abdominal pain, urinary symptoms, h/o HTN, or neck pain.  Past Medical History  Diagnosis Date  . Anxiety   . GERD (gastroesophageal reflux disease)   . Status post dilation of esophageal narrowing   . Sickle cell trait [facility])    Past Surgical History  Procedure Laterality Date  . Cesarean section    . Colonoscopy     Family History  Problem Relation Age of Onset  . Adopted: Yes  . Diabetes Other     10 out of 13 siblings  . Lung cancer Mother   . Cancer Mother   . Diabetes Mother   . Hypertension Other     siblings  . Diabetes Sister   . Hyperlipidemia Sister   . Diabetes Brother   . Hyperlipidemia Brother    Social History  Substance Use Topics  . Smoking status: Never Smoker   .  Smokeless tobacco: Never Used  . Alcohol Use: 1.2 oz/week    2 Standard drinks or equivalent per week     Comment: 1 per day   OB History    No data available     Review of Systems  Cardiovascular: Positive for palpitations.  Neurological: Positive for facial asymmetry, weakness and light-headedness.  All other systems reviewed and are negative.   Allergies  Nexium and Tussionex pennkinetic er  Home Medications   Prior to Admission medications   Medication Sig Start Date End Date Taking? Authorizing Provider  cetirizine (ZYRTEC) 10 MG tablet Take 1 tablet (10 mg total) by mouth daily. 09/19/15  Yes Olivia Canter Sam, PA-C  cimetidine (TAGAMET) 200 MG tablet Take 200 mg by mouth daily as needed (for heartburn).   Yes Historical Provider, MD  diphenhydrAMINE (BENADRYL) 25 MG tablet Take 25 mg by mouth every 6 (six) hours as needed for itching or allergies.    Yes Historical Provider, MD  fluticasone (FLONASE) 50 MCG/ACT nasal spray Place 1 spray into both nostrils daily as needed for allergies.  01/01/16  Yes Historical Provider, MD  amoxicillin (AMOXIL) 500 MG tablet Take 2 tablets (1,000 mg total) by mouth 2 (two) times daily. 06/15/15   Wardell Honour, MD  cyclobenzaprine (FLEXERIL) 5 MG tablet Take 1/2-1 tab po qhs prn Patient not taking: Reported on 06/15/2015 01/31/15   Thao P  Le, DO  methocarbamol (ROBAXIN) 500 MG tablet Take 1 tablet (500 mg total) by mouth 2 (two) times daily. 09/19/15   Olivia Canter Sam, PA-C  metroNIDAZOLE (METROGEL) 0.75 % vaginal gel Place 1 Applicatorful vaginally 2 (two) times daily. For 7 days Patient not taking: Reported on 06/15/2015 01/31/15   Thao P Le, DO  predniSONE (DELTASONE) 20 MG tablet Two tablets daily x 5 days then one tablet for days 06/15/15   Wardell Honour, MD  predniSONE (DELTASONE) 20 MG tablet Take 2 tablets (40 mg total) by mouth daily. 09/19/15   Olivia Canter Sam, PA-C  Vitamin D, Ergocalciferol, (DRISDOL) 50000 UNITS CAPS capsule Take 1 capsule  (50,000 Units total) by mouth every 7 (seven) days. Patient not taking: Reported on 06/15/2015 01/31/15   Thao P Le, DO   BP 130/77 mmHg  Pulse 84  Temp(Src) 98 F (36.7 C) (Oral)  Resp 11  Ht 5\' 7"  (1.702 m)  Wt 145 lb (65.772 kg)  BMI 22.71 kg/m2  SpO2 100%   Physical Exam  Constitutional: She is oriented to person, place, and time. She appears well-developed and well-nourished. No distress.  HENT:  Head: Normocephalic and atraumatic.  Eyes: EOM are normal.  Neck: Normal range of motion.  Cardiovascular: Normal rate, regular rhythm and normal heart sounds.   Pulmonary/Chest: Effort normal and breath sounds normal.  Abdominal: Soft. She exhibits no distension. There is no tenderness.  Musculoskeletal: Normal range of motion.  Neurological: She is alert and oriented to person, place, and time.  CN II-XII intact, EOMs intact, no visual field cuts, no pronator drift, strength; finger to nose normal, heel to shin normal; gait is normal.  Negative Romberg. No carotid bruits.  Skin: Skin is warm and dry.  Psychiatric: She has a normal mood and affect. Judgment normal.  Nursing note and vitals reviewed.   ED Course  Procedures  DIAGNOSTIC STUDIES:  Oxygen Saturation is 100% on RA, normal by my interpretation.    COORDINATION OF CARE:  4:30 PM Will order head CT, CBC, and BMP. Discussed treatment plan with pt at bedside and pt agreed to plan.  Labs Review Labs Reviewed  CBC WITH DIFFERENTIAL/PLATELET - Abnormal; Notable for the following:    WBC 3.7 (*)    Neutro Abs 1.5 (*)    All other components within normal limits  BASIC METABOLIC PANEL  Randolm Idol, ED    Imaging Review Dg Chest 2 View  01/22/2016  CLINICAL DATA:  Dizziness. EXAM: CHEST  2 VIEW COMPARISON:  None. FINDINGS: The heart size and mediastinal contours are within normal limits. Both lungs are clear. The visualized skeletal structures are unremarkable. IMPRESSION: No active cardiopulmonary disease.  Electronically Signed   By: Misty Stanley M.D.   On: 01/22/2016 17:19   Ct Head Wo Contrast  01/22/2016  CLINICAL DATA:  Left-sided numbness and tingling. EXAM: CT HEAD WITHOUT CONTRAST TECHNIQUE: Contiguous axial images were obtained from the base of the skull through the vertex without intravenous contrast. COMPARISON:  None. FINDINGS: Brain: No evidence of acute infarction, hemorrhage, extra-axial collection, ventriculomegaly, or mass effect. Vascular: No hyperdense vessel or unexpected calcification. Skull: Negative for fracture or focal lesion. Sinuses/Orbits: No acute findings. Other: None. IMPRESSION: No acute intracranial abnormality identified. Electronically Signed   By: Abelardo Diesel M.D.   On: 01/22/2016 17:38   Mr Brain Wo Contrast  01/22/2016  CLINICAL DATA:  Initial evaluation for acute left-sided numbness and facial droop EXAM: MRI HEAD WITHOUT CONTRAST TECHNIQUE: Multiplanar,  multiecho pulse sequences of the brain and surrounding structures were obtained without intravenous contrast. COMPARISON:  Prior CT from earlier the same day FINDINGS: Cerebral volume within normal limits for patient age. Patchy T2/FLAIR hyperintensity present within the periventricular and deep white matter both cerebral hemispheres, nonspecific, but most likely related to chronic small vessel ischemic disease, mild in nature. No abnormal foci of restricted diffusion to suggest acute intracranial infarct. Gray-white matter differentiation is maintained. Major intracranial vascular flow voids are preserved. No acute or chronic intracranial hemorrhage. No areas of chronic infarction. No mass lesion, midline shift, or mass effect. No hydrocephalus. No extra-axial fluid collection. Major dural sinuses are grossly patent. Craniocervical junction is normal. Visualized upper cervical spine unremarkable. Pituitary gland within normal limits. No acute abnormality about the globes and orbits. Paranasal sinuses are clear. No  mastoid effusion. Inner ear structures grossly unremarkable. Bone marrow signal intensity within normal limits. No scalp soft tissue abnormality. IMPRESSION: 1. No acute intracranial infarct or other process identified. 2. Mild chronic small vessel ischemic disease. Electronically Signed   By: Jeannine Boga M.D.   On: 01/22/2016 22:27   I have personally reviewed and evaluated these images and lab results as part of my medical decision-making.   EKG Interpretation   Date/Time:  Wednesday January 22 2016 16:22:00 EDT Ventricular Rate:  86 PR Interval:    QRS Duration: 80 QT Interval:  377 QTC Calculation: 451 R Axis:   75 Text Interpretation:  Sinus rhythm Normal ECG Confirmed by Maryan Rued  MD,  Loree Fee ([id number]) on 01/22/2016 4:39:27 PM      MDM   Final diagnoses:  Left facial numbness   Patient is a healthy 60 year old female presenting today with left facial droop that started spontaneously that think all start to feel dizzy and generally weak. The symptoms lasted a proximally 10 minutes and now she just has a mild irregular sensation in the left side of her face. Her neuro exam is within normal limits.  Labs are within normal limits. No carotid bruits and she does not have history of hypertension, diabetes, drug use or alcohol use. CT of the head within normal limits. Spoke with Dr. Nicole Kindred who recommended MRI to rule out small TIA. If that is normal she compiled with neurology. Patient for cardiac cause of her symptoms.  10:45 PM MRI neg for acute pathology.  No cause for pt sx today but low suspicion for stroke or seizure.  Will give f/u with neuro. I personally performed the services described in this documentation, which was scribed in my presence.  The recorded information has been reviewed and considered.   Blanchie Dessert, MD 01/22/16 [id number]  Blanchie Dessert, MD 01/22/16 [phone]

## 2016-01-22 NOTE — ED Notes (Signed)
Patient presents to the ed from work where she was walking out of a meeting and her friend noticed she was drooping on her left side and the patient states she felt like all of the blood rushed out of her head and to her feet to where she felt dizzy and weak bilaterally, patients symptoms then subsided by ems arrival at 80, pt presents to er alert, oriented, denies pain, ambulatory but still feels dizzy and is clammy, er md at bedside during triage

## 2016-02-04 DIAGNOSIS — M25532 Pain in left wrist: Secondary | ICD-10-CM | POA: Diagnosis not present

## 2016-02-04 DIAGNOSIS — M79642 Pain in left hand: Secondary | ICD-10-CM | POA: Diagnosis not present

## 2016-03-13 ENCOUNTER — Ambulatory Visit: Payer: BLUE CROSS/BLUE SHIELD | Admitting: Neurology

## 2016-08-08 ENCOUNTER — Ambulatory Visit (HOSPITAL_COMMUNITY)
Admission: EM | Admit: 2016-08-08 | Discharge: 2016-08-08 | Disposition: A | Discharge status: Home / Self Care | Payer: BLUE CROSS/BLUE SHIELD | Attending: Internal Medicine | Admitting: Internal Medicine

## 2016-08-08 ENCOUNTER — Encounter (HOSPITAL_COMMUNITY): Payer: Self-pay | Admitting: Emergency Medicine

## 2016-08-08 DIAGNOSIS — R05 Cough: Secondary | ICD-10-CM

## 2016-08-08 DIAGNOSIS — R059 Cough, unspecified: Secondary | ICD-10-CM

## 2016-08-08 DIAGNOSIS — J0100 Acute maxillary sinusitis, unspecified: Secondary | ICD-10-CM | POA: Diagnosis not present

## 2016-08-08 MED ORDER — AMOXICILLIN-POT CLAVULANATE 875-125 MG PO TABS: 1.0000 | ORAL_TABLET | Freq: Two times a day (BID) | ORAL | Status: DC

## 2016-08-08 MED ORDER — BENZONATATE 100 MG PO CAPS: 100.0000 mg | ORAL_CAPSULE | Freq: Three times a day (TID) | ORAL | 0 refills | Status: AC

## 2016-08-08 MED ORDER — FLUTICASONE PROPIONATE 50 MCG/ACT NA SUSP: 2.0000 | Freq: Every day | NASAL | 1 refills | Status: DC

## 2016-08-08 NOTE — ED Triage Notes (Signed)
The patient presented to the [facility] with a complaint of a cough, sinus pressure and a sore throat x6 days.

## 2016-08-08 NOTE — ED Provider Notes (Signed)
CSN: [id number]     Arrival date & time 08/08/16  1538 History   First MD Initiated Contact with Patient 08/08/16 1704     Chief Complaint  Patient presents with  . Facial Pain   (Consider location/radiation/quality/duration/timing/severity/associated sxs/prior Treatment) Patient is a well-appearing 61 year old female, presents today for a week history of nasal congestion, ear pain, sinus pain/pressure, coughing, and headache. Patient also endorses some mild nausea; not currently nauseous Patient denies fever but reports chills. Patient endorses positive sick exposures at work. Patient has tried Robitussin and other various over-the-counter medications with no relief. Patient feels that her symptoms are getting worst.         Past Medical History:  Diagnosis Date  . Anxiety   . GERD (gastroesophageal reflux disease)   . Sickle cell trait ([place])   . Status post dilation of esophageal narrowing    Past Surgical History:  Procedure Laterality Date  . CESAREAN SECTION    . COLONOSCOPY     Family History  Problem Relation Age of Onset  . Adopted: Yes  . Diabetes Other     10 out of 13 siblings  . Lung cancer Mother   . Cancer Mother   . Diabetes Mother   . Hypertension Other     siblings  . Diabetes Sister   . Hyperlipidemia Sister   . Diabetes Brother   . Hyperlipidemia Brother    Social History  Substance Use Topics  . Smoking status: Never Smoker  . Smokeless tobacco: Never Used  . Alcohol use 1.2 oz/week    2 Standard drinks or equivalent per week     Comment: 1 per day   OB History    No data available     Review of Systems  Constitutional: Positive for chills and fatigue. Negative for fever.  HENT: Positive for congestion, ear pain, sinus pain and sore throat. Negative for sneezing.   Respiratory: Positive for cough. Negative for shortness of breath.   Cardiovascular: Negative for chest pain and palpitations.  Gastrointestinal: Positive for nausea.  Negative for abdominal pain and vomiting.  Skin: Negative for rash.  Neurological: Positive for headaches. Negative for dizziness.    Allergies  Nexium [esomeprazole magnesium] and Tussionex pennkinetic er [hydrocod polst-cpm polst er]  Home Medications   Prior to Admission medications   Medication Sig Start Date End Date Taking? Authorizing Provider  cetirizine (ZYRTEC) 10 MG tablet Take 1 tablet (10 mg total) by mouth daily. 09/19/15  Yes Olivia Canter Sam, PA-C  amoxicillin-clavulanate (AUGMENTIN) 875-125 MG tablet Take 1 tablet by mouth 2 (two) times daily. 08/08/16   Barry Dienes, NP  benzonatate (TESSALON) 100 MG capsule Take 1 capsule (100 mg total) by mouth every 8 (eight) hours. 08/08/16 08/13/16  Barry Dienes, NP  fluticasone (FLONASE) 50 MCG/ACT nasal spray Place 2 sprays into both nostrils daily. 08/08/16 08/15/16  Barry Dienes, NP   Meds Ordered and Administered this Visit  Medications - No data to display  BP 144/78 (BP Location: Left Arm)   Pulse 84   Temp 97.8 F (36.6 C) (Oral)   Resp 16   SpO2 100%  No data found.   Physical Exam  Constitutional: She is oriented to person, place, and time. She appears well-developed and well-nourished.  HENT:  Head: Normocephalic and atraumatic.  Right Ear: External ear normal.  Left Ear: External ear normal.  Nose: Nose normal.  Mouth/Throat: Oropharynx is clear and moist. No oropharyngeal exudate.  TM pearly gray bilaterally with no  erythema. Bilateral maxillary sinuses are tender to percuss.  Eyes: Conjunctivae are normal. Pupils are equal, round, and reactive to light.  Neck: Normal range of motion.  Cardiovascular: Normal rate, regular rhythm and normal heart sounds.   Pulmonary/Chest: Effort normal and breath sounds normal. No respiratory distress. She has no wheezes.  Abdominal: Soft. Bowel sounds are normal. She exhibits no distension. There is no tenderness.  Neurological: She is alert and oriented to person, place, and time.   Skin: Skin is warm and dry.  Psychiatric: She has a normal mood and affect.  Nursing note and vitals reviewed.   Urgent Care Course   Clinical Course     Procedures (including critical care time)  Labs Review Labs Reviewed - No data to display  Imaging Review No results found.   MDM   1. Acute non-recurrent maxillary sinusitis   2. Coughing    Suspecting that patient has sinusitis. Will treat empirically with Augmentin twice a day 7 days and Flonase 2 sprays each nostril daily for the next 7 days. Prescription for Tessalon pearl given for the coughing. Informed to follow with primary care doctor if symptoms persist or does not improve.   BP rechecked and is 132/80    Barry Dienes, NP 08/08/16 1940

## 2016-08-08 NOTE — Discharge Instructions (Signed)
I switched the cough medicine from the one we discussed in the room to Tessalon pearl because of you are allergic to it.   I send the tessalon pearl cough medicine and flonase electronically to your pharmacy already.   Hope you feel better.

## 2016-09-05 ENCOUNTER — Ambulatory Visit (INDEPENDENT_AMBULATORY_CARE_PROVIDER_SITE_OTHER): Payer: BLUE CROSS/BLUE SHIELD | Admitting: Family Medicine

## 2016-09-05 VITALS — BP 140/90 | HR 101 | Temp 97.7°F | Resp 17 | Ht 68.0 in | Wt 145.4 lb

## 2016-09-05 DIAGNOSIS — J0101 Acute recurrent maxillary sinusitis: Secondary | ICD-10-CM

## 2016-09-05 MED ORDER — AZITHROMYCIN 250 MG PO TABS: ORAL_TABLET | ORAL | 0 refills | Status: DC

## 2016-09-05 NOTE — Patient Instructions (Addendum)
   IF you received an x-ray today, you will receive an invoice from [facility]. Please contact [facility] at [phone] with questions or concerns regarding your invoice.   IF you received labwork today, you will receive an invoice from [facility]. Please contact [facility] at [phone] with questions or concerns regarding your invoice.   Our billing staff will not be able to assist you with questions regarding bills from these companies.  You will be contacted with the lab results as soon as they are available. The fastest way to get your results is to activate your My Chart account. Instructions are located on the last page of this paperwork. If you have not heard from us regarding the results in 2 weeks, please contact this office.      Sinusitis, Adult Sinusitis is soreness and inflammation of your sinuses. Sinuses are hollow spaces in the bones around your face. Your sinuses are located:  Around your eyes.  In the middle of your forehead.  Behind your nose.  In your cheekbones. Your sinuses and nasal passages are lined with a stringy fluid (mucus). Mucus normally drains out of your sinuses. When your nasal tissues become inflamed or swollen, the mucus can become trapped or blocked so air cannot flow through your sinuses. This allows bacteria, viruses, and funguses to grow, which leads to infection. Sinusitis can develop quickly and last for 7?10 days (acute) or for more than 12 weeks (chronic). Sinusitis often develops after a cold. What are the causes? This condition is caused by anything that creates swelling in the sinuses or stops mucus from draining, including:  Allergies.  Asthma.  Bacterial or viral infection.  Abnormally shaped bones between the nasal passages.  Nasal growths that contain mucus (nasal polyps).  Narrow sinus openings.  Pollutants, such as chemicals or irritants in the air.  A foreign object stuck in the nose.  A fungal  infection. This is rare. What increases the risk? The following factors may make you more likely to develop this condition:  Having allergies or asthma.  Having had a recent cold or respiratory tract infection.  Having structural deformities or blockages in your nose or sinuses.  Having a weak immune system.  Doing a lot of swimming or diving.  Overusing nasal sprays.  Smoking. What are the signs or symptoms? The main symptoms of this condition are pain and a feeling of pressure around the affected sinuses. Other symptoms include:  Upper toothache.  Earache.  Headache.  Bad breath.  Decreased sense of smell and taste.  A cough that may get worse at night.  Fatigue.  Fever.  Thick drainage from your nose. The drainage is often green and it may contain pus (purulent).  Stuffy nose or congestion.  Postnasal drip. This is when extra mucus collects in the throat or back of the nose.  Swelling and warmth over the affected sinuses.  Sore throat.  Sensitivity to light. How is this diagnosed? This condition is diagnosed based on symptoms, a medical history, and a physical exam. To find out if your condition is acute or chronic, your health care provider may:  Look in your nose for signs of nasal polyps.  Tap over the affected sinus to check for signs of infection.  View the inside of your sinuses using an imaging device that has a light attached (endoscope). If your health care provider suspects that you have chronic sinusitis, you may also:  Be tested for allergies.  Have a sample of   mucus taken from your nose (nasal culture) and checked for bacteria.  Have a mucus sample examined to see if your sinusitis is related to an allergy. If your sinusitis does not respond to treatment and it lasts longer than 8 weeks, you may have an MRI or CT scan to check your sinuses. These scans also help to determine how severe your infection is. In rare cases, a bone biopsy may  be done to rule out more serious types of fungal sinus disease. How is this treated? Treatment for sinusitis depends on the cause and whether your condition is chronic or acute. If a virus is causing your sinusitis, your symptoms will go away on their own within 10 days. You may be given medicines to relieve your symptoms, including:  Topical nasal decongestants. They shrink swollen nasal passages and let mucus drain from your sinuses.  Antihistamines. These drugs block inflammation that is triggered by allergies. This can help to ease swelling in your nose and sinuses.  Topical nasal corticosteroids. These are nasal sprays that ease inflammation and swelling in your nose and sinuses.  Nasal saline washes. These rinses can help to get rid of thick mucus in your nose. If your condition is caused by bacteria, you will be given an antibiotic medicine. If your condition is caused by a fungus, you will be given an antifungal medicine. Surgery may be needed to correct underlying conditions, such as narrow nasal passages. Surgery may also be needed to remove polyps. Follow these instructions at home: Medicines   Take, use, or apply over-the-counter and prescription medicines only as told by your health care provider. These may include nasal sprays.  If you were prescribed an antibiotic medicine, take it as told by your health care provider. Do not stop taking the antibiotic even if you start to feel better. Hydrate and Humidify   Drink enough water to keep your urine clear or pale yellow. Staying hydrated will help to thin your mucus.  Use a cool mist humidifier to keep the humidity level in your home above 50%.  Inhale steam for 10-15 minutes, 3-4 times a day or as told by your health care provider. You can do this in the bathroom while a hot shower is running.  Limit your exposure to cool or dry air. Rest   Rest as much as possible.  Sleep with your head raised (elevated).  Make sure to  get enough sleep each night. General instructions   Apply a warm, moist washcloth to your face 3-4 times a day or as told by your health care provider. This will help with discomfort.  Wash your hands often with soap and water to reduce your exposure to viruses and other germs. If soap and water are not available, use hand sanitizer.  Do not smoke. Avoid being around people who are smoking (secondhand smoke).  Keep all follow-up visits as told by your health care provider. This is important. Contact a health care provider if:  You have a fever.  Your symptoms get worse.  Your symptoms do not improve within 10 days. Get help right away if:  You have a severe headache.  You have persistent vomiting.  You have pain or swelling around your face or eyes.  You have vision problems.  You develop confusion.  Your neck is stiff.  You have trouble breathing. This information is not intended to replace advice given to you by your health care provider. Make sure you discuss any questions you have with   your health care provider. Document Released: 07/13/2005 Document Revised: 03/08/2016 Document Reviewed: 05/08/2015 Elsevier Interactive Patient Education  2017 [employer]  

## 2016-09-05 NOTE — Progress Notes (Signed)
Chief Complaint  Patient presents with  . Facial Pressure    X 1 month  . Sinus Congestion    X 1 month  . Neck Pain    Left  . Cough    X 1 month  . Dizziness    X 1 week  . Headache    X 1 month    HPI  Sinus Pain: Patient complains of bilateral ear pressure/pain, congestion, facial pain, headache described as with pressure in her forehead mostly that is dull or sharp., lightheadedness, nasal congestion and non productive cough. There is no fever, chills, night sweats or weight loss. Onset of symptoms was 1 month ago, gradually worsening since that time. She is drinking plenty of fluids.  Past history is significant for no history of pneumonia or bronchitis. Patient is non-smoker She went to Urgent Care and was given Augmentin and could not tolerate it due to diarrhea and upset stomach.   Past Medical History:  Diagnosis Date  . Anxiety   . GERD (gastroesophageal reflux disease)   . Sickle cell trait ([place])   . Status post dilation of esophageal narrowing     Current Outpatient Prescriptions  Medication Sig Dispense Refill  . cetirizine (ZYRTEC) 10 MG tablet Take 1 tablet (10 mg total) by mouth daily. 30 tablet 0  . azithromycin (ZITHROMAX) 250 MG tablet Take 2 tablets on day 1, then one tab each day. Then repeat second zpak. Dispense 2 packs. 12 tablet 0  . fluticasone (FLONASE) 50 MCG/ACT nasal spray Place 2 sprays into both nostrils daily. 9.9 g 1   No current facility-administered medications for this visit.     Allergies:  Allergies  Allergen Reactions  . Nexium [Esomeprazole Magnesium] Itching  . Tussionex Pennkinetic Er [Hydrocod Polst-Cpm Polst Er] Itching    Past Surgical History:  Procedure Laterality Date  . CESAREAN SECTION    . COLONOSCOPY      Social History   Social History  . Marital status: Single    Spouse name: N/A  . Number of children: 3  . Years of education: N/A   Occupational History  .  [employer]   Social History Main  Topics  . Smoking status: Never Smoker  . Smokeless tobacco: Never Used  . Alcohol use 1.2 oz/week    2 Standard drinks or equivalent per week     Comment: 1 per day  . Drug use: No  . Sexual activity: Not Asked   Other Topics Concern  . None   Social History Narrative  . None    ROS See hpi  Objective: Vitals:   09/05/16 1027  BP: 140/90  Pulse: (!) 101  Resp: 17  Temp: 97.7 F (36.5 C)  TempSrc: Oral  SpO2: 100%  Weight: 145 lb 6.4 oz (66 kg)  Height: 5\' 8"  (1.727 m)    Physical Exam General: alert, oriented, in NAD Head: normocephalic, atraumatic, + frontal sinus tenderness Eyes: EOM intact, no scleral icterus or conjunctival injection Ears: TM clear bilaterally Throat: no pharyngeal exudate or erythema Lymph: no posterior auricular, submental or cervical lymph adenopathy Heart: normal rate, normal sinus rhythm, no murmurs Lungs: clear to auscultation bilaterally, no wheezing   Assessment and Plan Aarshi was seen today for facial pressure, sinus congestion, neck pain, cough, dizziness and headache.  Diagnoses and all orders for this visit:  Acute recurrent maxillary sinusitis-  Discussed otc decongestant Advised flonase for congestion  Increase hydration Vitamin C and zinc lozenges suggested Return to  clinic if symptoms worse  -     azithromycin (ZITHROMAX) 250 MG tablet; Take 2 tablets on day 1, then one tab each day. Then repeat second zpak. Dispense 2 packs.     [place]

## 2016-10-09 ENCOUNTER — Ambulatory Visit: Payer: BLUE CROSS/BLUE SHIELD

## 2016-10-12 DIAGNOSIS — Z130 Encounter for screening for diseases of the blood and blood-forming organs and certain disorders involving the immune mechanism: Secondary | ICD-10-CM | POA: Diagnosis not present

## 2016-10-12 DIAGNOSIS — R222 Localized swelling, mass and lump, trunk: Secondary | ICD-10-CM | POA: Diagnosis not present

## 2016-10-12 DIAGNOSIS — Z1389 Encounter for screening for other disorder: Secondary | ICD-10-CM | POA: Diagnosis not present

## 2016-10-12 DIAGNOSIS — Z01419 Encounter for gynecological examination (general) (routine) without abnormal findings: Secondary | ICD-10-CM | POA: Diagnosis not present

## 2016-10-12 DIAGNOSIS — Z124 Encounter for screening for malignant neoplasm of cervix: Secondary | ICD-10-CM | POA: Diagnosis not present

## 2016-10-21 ENCOUNTER — Ambulatory Visit (INDEPENDENT_AMBULATORY_CARE_PROVIDER_SITE_OTHER): Payer: BLUE CROSS/BLUE SHIELD | Admitting: Family Medicine

## 2016-10-21 ENCOUNTER — Encounter: Payer: Self-pay | Admitting: Family Medicine

## 2016-10-21 VITALS — BP 121/77 | HR 83 | Temp 97.8°F | Resp 16 | Ht 68.0 in | Wt 143.0 lb

## 2016-10-21 DIAGNOSIS — Z13220 Encounter for screening for lipoid disorders: Secondary | ICD-10-CM | POA: Diagnosis not present

## 2016-10-21 DIAGNOSIS — G5702 Lesion of sciatic nerve, left lower limb: Secondary | ICD-10-CM | POA: Diagnosis not present

## 2016-10-21 DIAGNOSIS — M62838 Other muscle spasm: Secondary | ICD-10-CM | POA: Diagnosis not present

## 2016-10-21 DIAGNOSIS — Z0000 Encounter for general adult medical examination without abnormal findings: Secondary | ICD-10-CM | POA: Diagnosis not present

## 2016-10-21 DIAGNOSIS — Z131 Encounter for screening for diabetes mellitus: Secondary | ICD-10-CM | POA: Diagnosis not present

## 2016-10-21 MED ORDER — CYCLOBENZAPRINE HCL 5 MG PO TABS: 5.0000 mg | ORAL_TABLET | Freq: Three times a day (TID) | ORAL | 1 refills | Status: DC | PRN

## 2016-10-21 NOTE — Patient Instructions (Addendum)
We recommend that you schedule a mammogram for breast cancer screening. Typically, you do not need a referral to do this. Please contact a local imaging center to schedule your mammogram.  [facility] - [phone]  *ask for the Radiology Department [facility] ([place]) - [phone] or [phone]  [facility] - [phone] [place] [phone] [facility] - [phone]  *ask for the [facility] - [phone]  *ask for the Radiology Department [facility] - [phone]  *ask for the [place] - [phone]    IF you received an x-ray today, you will receive an invoice from [facility]. Please contact [facility] at [phone] with questions or concerns regarding your invoice.   IF you received labwork today, you will receive an invoice from [facility]. Please contact [facility] at [phone] with questions or concerns regarding your invoice.   Our billing staff will not be able to assist you with questions regarding bills from these companies.  You will be contacted with the lab results as soon as they are available. The fastest way to get your results is to activate your My Chart account. Instructions are located on the last page of this paperwork. If you have not heard from [place] regarding the results in 2 weeks, please contact this office.      Piriformis Syndrome Rehab Ask your health care provider which exercises are safe for you. Do exercises exactly as told by your health care provider and adjust them as directed. It is normal to feel mild stretching, pulling, tightness, or discomfort as you do these exercises, but you should stop right away if you feel sudden pain or your pain gets worse.Do not begin these exercises until told by your health care provider. Stretching and range of motion exercises These  exercises warm up your muscles and joints and improve the movement and flexibility of your hip and pelvis. These exercises also help to relieve pain, numbness, and tingling. Exercise A: Hip rotators   1. Lie on your back on a firm surface. 2. Pull your left / right knee toward your same shoulder with your left / right hand until your knee is pointing toward the ceiling. Hold your left / right ankle with your other hand. 3. Keeping your knee steady, gently pull your left / right ankle toward your other shoulder until you feel a stretch in your buttocks. 4. Hold this position for __________ seconds. Repeat __________ times. Complete this stretch __________ times a day. Exercise B: Hip extensors  1. Lie on your back on a firm surface. Both of your legs should be straight. 2. Pull your left / right knee to your chest. Hold your leg in this position by holding onto the back of your thigh or the front of your knee. 3. Hold this position for __________ seconds. 4. Slowly return to the starting position. Repeat __________ times. Complete this stretch __________ times a day. Strengthening exercises These exercises build strength and endurance in your hip and thigh muscles. Endurance is the ability to use your muscles for a long time, even after they get tired. Exercise C: Straight leg raises (  hip abductors) 1. Lie on your side with your left / right leg in the top position. Lie so your head, shoulder, knee, and hip line up. Bend your bottom knee to help you balance. 2. Lift your top leg up 4-6 inches (10-15  cm), keeping your toes pointed straight ahead. 3. Hold this position for __________ seconds. 4. Slowly lower your leg to the starting position. Let your muscles relax completely. Repeat __________ times. Complete this exercise__________ times a day. Exercise D: Hip abductors and rotators, quadruped  1. Get on your hands and knees on a firm, lightly padded surface. Your hands should be directly  below your shoulders, and your knees should be directly below your hips. 2. Lift your left / right knee out to the side. Keep your knee bent. Do not twist your body. 3. Hold this position for __________ seconds. 4. Slowly lower your leg. Repeat __________ times. Complete this exercise__________ times a day. Exercise E: Straight leg raises ( hip extensors) 1. Lie on your abdomen on a bed or a firm surface with a pillow under your hips. 2. Squeeze your buttock muscles and lift your left / right thigh off the bed. Do not let your back arch. 3. Hold this position for __________ seconds. 4. Slowly return to the starting position. Let your muscles relax completely before doing another repetition. Repeat __________ times. Complete this exercise__________ times a day. This information is not intended to replace advice given to you by your health care provider. Make sure you discuss any questions you have with your health care provider. Document Released: 07/13/2005 Document Revised: 03/17/2016 Document Reviewed: 06/25/2015 Elsevier Interactive Patient Education  2017 [employer].

## 2016-10-21 NOTE — Progress Notes (Signed)
Chief Complaint  Patient presents with  . Annual Exam    Subjective:  Teresa Orr is a 61 y.o. female here for a health maintenance visit.  Patient is established pt  Pt reports 2 week history of neck spasms and shoulder tightness As well as left side pain in her buttock States that she uses tiger balm without relief No acute injury She stands long hours at work but uses inserts to support her arches  Patient Active Problem List   Diagnosis Date Noted  . DIZZINESS 02/06/2008  . BACK PAIN 12/29/2007  . PERIPHERAL EDEMA 12/29/2007  . ABDOMINAL PAIN, LEFT LOWER QUADRANT 12/29/2007  . ANXIETY 04/15/2007  . DEPRESSION 04/15/2007  . INSOMNIA 04/15/2007  . ANEMIA-IRON DEFICIENCY 04/09/2007  . ESOPHAGITIS 04/09/2007  . GERD 04/09/2007  . IBS 04/09/2007  . ALLERGY, HX OF 04/09/2007    Past Medical History:  Diagnosis Date  . Anxiety   . GERD (gastroesophageal reflux disease)   . Sickle cell trait ([place])   . Status post dilation of esophageal narrowing     Past Surgical History:  Procedure Laterality Date  . CESAREAN SECTION    . COLONOSCOPY       Outpatient Medications Prior to Visit  Medication Sig Dispense Refill  . cetirizine (ZYRTEC) 10 MG tablet Take 1 tablet (10 mg total) by mouth daily. 30 tablet 0  . fluticasone (FLONASE) 50 MCG/ACT nasal spray Place 2 sprays into both nostrils daily. 9.9 g 1  . azithromycin (ZITHROMAX) 250 MG tablet Take 2 tablets on day 1, then one tab each day. Then repeat second zpak. Dispense 2 packs. (Patient not taking: Reported on 10/21/2016) 12 tablet 0   No facility-administered medications prior to visit.     Allergies  Allergen Reactions  . Nexium [Esomeprazole Magnesium] Itching  . Tussionex Pennkinetic Er [Hydrocod Polst-Cpm Polst Er] Itching     Family History  Problem Relation Age of Onset  . Adopted: Yes  . Lung cancer Mother   . Cancer Mother   . Diabetes Mother   . Diabetes Sister   . Hyperlipidemia Sister   .  Diabetes Brother   . Hyperlipidemia Brother   . Diabetes Other     10 out of 13 siblings  . Hypertension Other     siblings     Health Habits: Dental Exam: not up to date Eye Exam: not up to date Exercise:  times/week on average Current exercise activities: walking/running Diet: balanced  Social History   Social History  . Marital status: Single    Spouse name: N/A  . Number of children: 3  . Years of education: N/A   Occupational History  .  [employer]   Social History Main Topics  . Smoking status: Never Smoker  . Smokeless tobacco: Never Used  . Alcohol use 1.2 oz/week    2 Standard drinks or equivalent per week     Comment: 1 per day  . Drug use: No  . Sexual activity: Not on file   Other Topics Concern  . Not on file   Social History Narrative  . No narrative on file   History  Alcohol Use  . 1.2 oz/week  . 2 Standard drinks or equivalent per week    Comment: 1 per day   History  Smoking Status  . Never Smoker  Smokeless Tobacco  . Never Used   History  Drug Use No    GYN: Sexual Health Menstrual status: regular menses LMP: No LMP  recorded. Patient is postmenopausal. Last pap smear: see HM section History of abnormal pap smears:    Health Maintenance: See under health Maintenance activity for review of completion dates as well. Immunization History  Administered Date(s) Administered  . Influenza Split 04/26/2015  . Influenza-Unspecified 04/26/2016  . Td 07/27/2000      Depression Screen-PHQ2/9 Depression screen [facility] 2/9 10/21/2016 09/05/2016 06/15/2015 01/31/2015 01/21/2015  Decreased Interest 0 0 0 0 0  Down, Depressed, Hopeless 0 0 0 0 0  PHQ - 2 Score 0 0 0 0 0       Depression Severity and Treatment Recommendations:  0-4= None  5-9= Mild / Treatment: Support, educate to call if worse; return in one month  10-14= Moderate / Treatment: Support, watchful waiting; Antidepressant or Psycotherapy  15-19= Moderately severe /  Treatment: Antidepressant OR Psychotherapy  >= 20 = Major depression, severe / Antidepressant AND Psychotherapy    Review of Systems   Review of Systems  Constitutional: Negative for chills, fever, malaise/fatigue and weight loss.  HENT: Positive for congestion and sinus pain.   Eyes: Negative for blurred vision, double vision and photophobia.  Gastrointestinal: Negative for abdominal pain, nausea and vomiting.  Musculoskeletal: Positive for neck pain.  Neurological: Positive for headaches.  Endo/Heme/Allergies: Positive for environmental allergies.    See HPI for ROS as well.    Objective:   Vitals:   10/21/16 1058  BP: 121/77  Pulse: 83  Resp: 16  Temp: 97.8 F (36.6 C)  TempSrc: Oral  SpO2: 99%  Weight: 143 lb (64.9 kg)  Height: 5\' 8"  (1.727 m)    Body mass index is 21.74 kg/m.  Physical Exam  Constitutional: She is oriented to person, place, and time. She appears well-developed and well-nourished.  HENT:  Head: Normocephalic and atraumatic.  Right Ear: External ear normal.  Left Ear: External ear normal.  Nose: Nose normal.  Mouth/Throat: Oropharynx is clear and moist.  Eyes: Conjunctivae and EOM are normal.  Neck: No thyromegaly present.  Cardiovascular: Normal rate, regular rhythm and normal heart sounds.   Pulmonary/Chest: Effort normal and breath sounds normal. No respiratory distress. She has no wheezes. She has no rales.  Abdominal: Soft. Bowel sounds are normal. She exhibits no distension. There is no tenderness. There is no rebound and no guarding.  Musculoskeletal: Normal range of motion. She exhibits no edema.       Cervical back: She exhibits spasm. She exhibits normal range of motion, no tenderness, no bony tenderness, no swelling, no edema, no deformity and normal pulse.       Back:  Lymphadenopathy:    She has no cervical adenopathy.  Neurological: She is alert and oriented to person, place, and time. She has normal reflexes.  Skin: Skin is  warm. No erythema.  Psychiatric: She has a normal mood and affect. Her behavior is normal. Judgment and thought content normal.       Assessment/Plan:   Patient was seen for a health maintenance exam.  Counseled the patient on health maintenance issues. Reviewed her health mainteance schedule and ordered appropriate tests (see orders.) Counseled on regular exercise and weight management. Recommend regular eye exams and dental cleaning.   The following issues were addressed today for health maintenance:   Andretta was seen today for annual exam.  Diagnoses and all orders for this visit:  Piriformis syndrome, left -     Ambulatory referral to Physical Therapy -     cyclobenzaprine (FLEXERIL) 5 MG tablet; Take 1 tablet (  5 mg total) by mouth 3 (three) times daily as needed for muscle spasms.  Health maintenance examination- completed breast and cervical cancer screening with Gynecology a week ago Completing cardiovascular risk stratification labs -     Lipid panel -     Hemoglobin A1c -     Basic metabolic panel  Neck muscle spasm- follow up with PT Muscle relaxers -     Ambulatory referral to Physical Therapy -     cyclobenzaprine (FLEXERIL) 5 MG tablet; Take 1 tablet (5 mg total) by mouth 3 (three) times daily as needed for muscle spasms.  Screening for hyperlipidemia -     Lipid panel -     Basic metabolic panel  Screening for diabetes mellitus -     Hemoglobin A1c -     Basic metabolic panel    No Follow-up on file.    Body mass index is 21.74 kg/m.:  Discussed the patient's BMI with patient. The BMI body mass index is 21.74 kg/m.     No future appointments.  Patient Instructions   We recommend that you schedule a mammogram for breast cancer screening. Typically, you do not need a referral to do this. Please contact a local imaging center to schedule your mammogram.  [facility] - [phone]  *ask for the Radiology Department [facility]  ([place]) - [phone] or [phone]  [facility] - [phone] [place] [phone] [facility] - [phone]  *ask for the [facility] - [phone]  *ask for the Radiology Department [facility] - [phone]  *ask for the [facility] - [phone]    IF you received an x-ray today, you will receive an invoice from [facility]. Please contact [facility] Radiology at [phone] with questions or concerns regarding your invoice.   IF you received labwork today, you will receive an invoice from [facility]. Please contact [facility] at [phone] with questions or concerns regarding your invoice.   Our billing staff will not be able to assist you with questions regarding bills from these companies.  You will be contacted with the lab results as soon as they are available. The fastest way to get your results is to activate your My Chart account. Instructions are located on the last page of this paperwork. If you have not heard from [place] regarding the results in 2 weeks, please contact this office.      Piriformis Syndrome Rehab Ask your health care provider which exercises are safe for you. Do exercises exactly as told by your health care provider and adjust them as directed. It is normal to feel mild stretching, pulling, tightness, or discomfort as you do these exercises, but you should stop right away if you feel sudden pain or your pain gets worse.Do not begin these exercises until told by your health care provider. Stretching and range of motion exercises These exercises warm up your muscles and joints and improve the movement and flexibility of your hip and pelvis. These exercises also help to relieve pain, numbness, and tingling. Exercise A: Hip rotators   1. Lie on your back on a firm surface. 2. Pull your left / right knee toward  your same shoulder with your left / right hand until your knee is pointing toward the ceiling. Hold your left / right ankle with your other hand. 3. Keeping your knee steady, gently pull your left / right ankle  toward your other shoulder until you feel a stretch in your buttocks. 4. Hold this position for __________ seconds. Repeat __________ times. Complete this stretch __________ times a day. Exercise B: Hip extensors  1. Lie on your back on a firm surface. Both of your legs should be straight. 2. Pull your left / right knee to your chest. Hold your leg in this position by holding onto the back of your thigh or the front of your knee. 3. Hold this position for __________ seconds. 4. Slowly return to the starting position. Repeat __________ times. Complete this stretch __________ times a day. Strengthening exercises These exercises build strength and endurance in your hip and thigh muscles. Endurance is the ability to use your muscles for a long time, even after they get tired. Exercise C: Straight leg raises (  hip abductors) 1. Lie on your side with your left / right leg in the top position. Lie so your head, shoulder, knee, and hip line up. Bend your bottom knee to help you balance. 2. Lift your top leg up 4-6 inches (10-15 cm), keeping your toes pointed straight ahead. 3. Hold this position for __________ seconds. 4. Slowly lower your leg to the starting position. Let your muscles relax completely. Repeat __________ times. Complete this exercise__________ times a day. Exercise D: Hip abductors and rotators, quadruped  1. Get on your hands and knees on a firm, lightly padded surface. Your hands should be directly below your shoulders, and your knees should be directly below your hips. 2. Lift your left / right knee out to the side. Keep your knee bent. Do not twist your body. 3. Hold this position for __________ seconds. 4. Slowly lower your leg. Repeat __________ times. Complete this  exercise__________ times a day. Exercise E: Straight leg raises ( hip extensors) 1. Lie on your abdomen on a bed or a firm surface with a pillow under your hips. 2. Squeeze your buttock muscles and lift your left / right thigh off the bed. Do not let your back arch. 3. Hold this position for __________ seconds. 4. Slowly return to the starting position. Let your muscles relax completely before doing another repetition. Repeat __________ times. Complete this exercise__________ times a day. This information is not intended to replace advice given to you by your health care provider. Make sure you discuss any questions you have with your health care provider. Document Released: 07/13/2005 Document Revised: 03/17/2016 Document Reviewed: 06/25/2015 Elsevier Interactive Patient Education  2017 [employer].

## 2016-10-22 LAB — BASIC METABOLIC PANEL
BUN/Creatinine Ratio: 17 (ref 9–23)
BUN: 12 mg/dL (ref 6–24)
CALCIUM: 9.4 mg/dL (ref 8.7–10.2)
CO2: 28 mmol/L (ref 18–29)
CREATININE: 0.69 mg/dL (ref 0.57–1.00)
Chloride: 100 mmol/L (ref 96–106)
GFR, EST AFRICAN AMERICAN: 110 mL/min/{1.73_m2} (ref >59)
GFR, EST NON AFRICAN AMERICAN: 96 mL/min/{1.73_m2} (ref >59)
Glucose: 90 mg/dL (ref 65–99)
Potassium: 4.2 mmol/L (ref 3.5–5.2)
SODIUM: 141 mmol/L (ref 134–144)

## 2016-10-22 LAB — LIPID PANEL
CHOL/HDL RATIO: 2.5 ratio (ref 0.0–4.4)
Cholesterol, Total: 157 mg/dL (ref 100–199)
HDL: 63 mg/dL (ref >39)
LDL Calculated: 81 mg/dL (ref 0–99)
TRIGLYCERIDES: 64 mg/dL (ref 0–149)
VLDL CHOLESTEROL CAL: 13 mg/dL (ref 5–40)

## 2016-10-22 LAB — HEMOGLOBIN A1C
Est. average glucose Bld gHb Est-mCnc: 117 mg/dL
Hgb A1c MFr Bld: 5.7 % — ABNORMAL HIGH (ref 4.8–5.6)

## 2016-11-04 ENCOUNTER — Encounter (HOSPITAL_COMMUNITY): Payer: Self-pay | Admitting: Emergency Medicine

## 2016-11-04 ENCOUNTER — Ambulatory Visit: Payer: BLUE CROSS/BLUE SHIELD

## 2016-11-04 ENCOUNTER — Ambulatory Visit (HOSPITAL_COMMUNITY)
Admission: EM | Admit: 2016-11-04 | Discharge: 2016-11-04 | Disposition: A | Discharge status: Home / Self Care | Payer: BLUE CROSS/BLUE SHIELD | Attending: Family Medicine | Admitting: Family Medicine

## 2016-11-04 DIAGNOSIS — J039 Acute tonsillitis, unspecified: Secondary | ICD-10-CM

## 2016-11-04 MED ORDER — CHLORHEXIDINE GLUCONATE 0.12 % MT SOLN: 15.0000 mL | Freq: Two times a day (BID) | OROMUCOSAL | 0 refills | Status: DC

## 2016-11-04 MED ORDER — AMOXICILLIN 875 MG PO TABS: 875.0000 mg | ORAL_TABLET | Freq: Two times a day (BID) | ORAL | 0 refills | Status: DC

## 2016-11-04 MED ORDER — BUTALBITAL-APAP-CAFFEINE 50-325-40 MG PO TABS: 1.0000 | ORAL_TABLET | Freq: Four times a day (QID) | ORAL | 0 refills | Status: AC | PRN

## 2016-11-04 NOTE — ED Triage Notes (Addendum)
Pt reports a sore throat starting two days ago along with a headache and body aches.  She reports a fever over 101 last night and took tylenol for it.  Pt also reports taking 2 doses of Amoxicillin yesterday from a left over prescription at home.

## 2016-11-04 NOTE — ED Provider Notes (Signed)
[place]    CSN: [id number] Arrival date & time: 11/04/16  1000     History   Chief Complaint Chief Complaint  Patient presents with  . Sore Throat    HPI Teresa Orr is a 61 y.o. female.   Pt reports a sore throat starting two days ago along with a headache and body aches.  She reports a fever over 101 last night and took tylenol for it.  Pt also reports taking 2 doses of Amoxicillin yesterday from a left over prescription at home.  She states that in addition to the severe sore throat, she has a headache on the left side of her head. She had some swollen glands as well. She's having difficulty swallowing.  Patient works for [employer] and is unable to go to work today because of the pain.      Past Medical History:  Diagnosis Date  . Anxiety   . GERD (gastroesophageal reflux disease)   . Sickle cell trait ([place])   . Status post dilation of esophageal narrowing     Patient Active Problem List   Diagnosis Date Noted  . DIZZINESS 02/06/2008  . BACK PAIN 12/29/2007  . PERIPHERAL EDEMA 12/29/2007  . ABDOMINAL PAIN, LEFT LOWER QUADRANT 12/29/2007  . ANXIETY 04/15/2007  . DEPRESSION 04/15/2007  . INSOMNIA 04/15/2007  . ANEMIA-IRON DEFICIENCY 04/09/2007  . ESOPHAGITIS 04/09/2007  . GERD 04/09/2007  . IBS 04/09/2007  . ALLERGY, HX OF 04/09/2007    Past Surgical History:  Procedure Laterality Date  . CESAREAN SECTION    . COLONOSCOPY      OB History    No data available       Home Medications    Prior to Admission medications   Medication Sig Start Date End Date Taking? Authorizing Provider  cetirizine (ZYRTEC) 10 MG tablet Take 1 tablet (10 mg total) by mouth daily. 09/19/15  Yes Olivia Canter Sam, PA-C  amoxicillin (AMOXIL) 875 MG tablet Take 1 tablet (875 mg total) by mouth 2 (two) times daily. 11/04/16   Robyn Haber, MD  butalbital-acetaminophen-caffeine (FIORICET, ESGIC) 2058537670 MG tablet Take 1-2 tablets by mouth every  6 (six) hours as needed for headache. 11/04/16 11/04/17  Robyn Haber, MD  chlorhexidine (PERIDEX) 0.12 % solution Use as directed 15 mLs in the mouth or throat 2 (two) times daily. 11/04/16   Robyn Haber, MD  Cimetidine (TAGAMET PO) Take by mouth.    Historical Provider, MD  cyclobenzaprine (FLEXERIL) 5 MG tablet Take 1 tablet (5 mg total) by mouth 3 (three) times daily as needed for muscle spasms. 10/21/16   Forrest Moron, MD  fluticasone (FLONASE) 50 MCG/ACT nasal spray Place 2 sprays into both nostrils daily. 08/08/16 08/15/16  Barry Dienes, NP    Family History Family History  Problem Relation Age of Onset  . Adopted: Yes  . Lung cancer Mother   . Cancer Mother   . Diabetes Mother   . Diabetes Sister   . Hyperlipidemia Sister   . Diabetes Brother   . Hyperlipidemia Brother   . Diabetes Other     10 out of 13 siblings  . Hypertension Other     siblings    Social History Social History  Substance Use Topics  . Smoking status: Never Smoker  . Smokeless tobacco: Never Used  . Alcohol use 1.2 oz/week    2 Standard drinks or equivalent per week     Comment: 1 per day     Allergies  Nexium [esomeprazole magnesium] and Tussionex pennkinetic er [employer] er]   Review of Systems Review of Systems  Constitutional: Positive for chills, diaphoresis, fatigue and fever.  HENT: Positive for sore throat.   Respiratory: Negative.   Gastrointestinal: Negative.      Physical Exam Triage Vital Signs ED Triage Vitals [11/04/16 1025]  Enc Vitals Group     BP 104/72     Pulse Rate (!) 112     Resp      Temp 99.3 F (37.4 C)     Temp Source Oral     SpO2 97 %     Weight      Height      Head Circumference      Peak Flow      Pain Score 8     Pain Loc      Pain Edu?      Excl. in [place]?    No data found.   Updated Vital Signs BP 104/72 (BP Location: Left Arm)   Pulse (!) 112   Temp 99.3 F (37.4 C) (Oral)   SpO2 97%    Physical Exam    Constitutional: She is oriented to person, place, and time. She appears well-developed and well-nourished.  HENT:  Head: Normocephalic.  Right Ear: External ear normal.  Left Ear: External ear normal.  Mouth/Throat: Oropharyngeal exudate present.  Tonsils are swollen  Eyes: Conjunctivae and EOM are normal. Pupils are equal, round, and reactive to light.  Neck: Normal range of motion. Neck supple.  Pulmonary/Chest: Effort normal.  Musculoskeletal: Normal range of motion.  Lymphadenopathy:    She has cervical adenopathy.  Neurological: She is alert and oriented to person, place, and time.  Skin: Skin is warm and dry.  Nursing note and vitals reviewed.    UC Treatments / Results  Labs (all labs ordered are listed, but only abnormal results are displayed) Labs Reviewed - No data to display  EKG  EKG Interpretation None       Radiology No results found.  Procedures Procedures (including critical care time)  Medications Ordered in UC Medications - No data to display   Initial Impression / Assessment and Plan / UC Course  I have reviewed the triage vital signs and the nursing notes.  Pertinent labs & imaging results that were available during my care of the patient were reviewed by me and considered in my medical decision making (see chart for details).     Final Clinical Impressions(s) / UC Diagnoses   Final diagnoses:  Tonsillitis    New Prescriptions New Prescriptions   AMOXICILLIN (AMOXIL) 875 MG TABLET    Take 1 tablet (875 mg total) by mouth 2 (two) times daily.   BUTALBITAL-ACETAMINOPHEN-CAFFEINE (FIORICET, ESGIC) 50-325-40 MG TABLET    Take 1-2 tablets by mouth every 6 (six) hours as needed for headache.   CHLORHEXIDINE (PERIDEX) 0.12 % SOLUTION    Use as directed 15 mLs in the mouth or throat 2 (two) times daily.     Robyn Haber, MD 11/04/16 1043

## 2016-11-05 ENCOUNTER — Telehealth (HOSPITAL_COMMUNITY): Payer: Self-pay | Admitting: Emergency Medicine

## 2016-11-05 NOTE — Telephone Encounter (Signed)
Notified patient that no change would be made to pain medicine prescribed by dr Joseph Art.  Notified dr Joseph Art had reviewed her chart and if head ache not helped by medication, she was to go to emergency department.  Patient ended phone call

## 2016-12-15 DIAGNOSIS — N6332 Unspecified lump in axillary tail of the left breast: Secondary | ICD-10-CM | POA: Diagnosis not present

## 2016-12-15 DIAGNOSIS — R2232 Localized swelling, mass and lump, left upper limb: Secondary | ICD-10-CM | POA: Diagnosis not present

## 2017-01-30 ENCOUNTER — Other Ambulatory Visit: Payer: Self-pay | Admitting: Family Medicine

## 2017-01-30 DIAGNOSIS — M62838 Other muscle spasm: Secondary | ICD-10-CM

## 2017-01-30 DIAGNOSIS — G5702 Lesion of sciatic nerve, left lower limb: Secondary | ICD-10-CM

## 2017-05-08 ENCOUNTER — Emergency Department (HOSPITAL_COMMUNITY)
Admission: EM | Admit: 2017-05-08 | Discharge: 2017-05-08 | Disposition: A | Discharge status: Home / Self Care | Payer: BLUE CROSS/BLUE SHIELD | Attending: Emergency Medicine | Admitting: Emergency Medicine

## 2017-05-08 ENCOUNTER — Emergency Department (HOSPITAL_COMMUNITY): Payer: BLUE CROSS/BLUE SHIELD

## 2017-05-08 ENCOUNTER — Encounter (HOSPITAL_COMMUNITY): Payer: Self-pay | Admitting: Emergency Medicine

## 2017-05-08 DIAGNOSIS — R1010 Upper abdominal pain, unspecified: Secondary | ICD-10-CM | POA: Diagnosis not present

## 2017-05-08 DIAGNOSIS — M545 Low back pain: Secondary | ICD-10-CM | POA: Diagnosis present

## 2017-05-08 DIAGNOSIS — M5441 Lumbago with sciatica, right side: Secondary | ICD-10-CM | POA: Diagnosis not present

## 2017-05-08 DIAGNOSIS — R1033 Periumbilical pain: Secondary | ICD-10-CM | POA: Diagnosis not present

## 2017-05-08 DIAGNOSIS — R109 Unspecified abdominal pain: Secondary | ICD-10-CM | POA: Diagnosis not present

## 2017-05-08 LAB — COMPREHENSIVE METABOLIC PANEL
ALT: 15 U/L (ref 14–54)
AST: 20 U/L (ref 15–41)
Albumin: 4.3 g/dL (ref 3.5–5.0)
Alkaline Phosphatase: 66 U/L (ref 38–126)
Anion gap: 9 (ref 5–15)
BUN: 11 mg/dL (ref 6–20)
CO2: 28 mmol/L (ref 22–32)
Calcium: 9.2 mg/dL (ref 8.9–10.3)
Chloride: 104 mmol/L (ref 101–111)
Creatinine, Ser: 0.89 mg/dL (ref 0.44–1.00)
GFR calc Af Amer: >60 mL/min (ref >60)
GFR calc non Af Amer: >60 mL/min (ref >60)
Glucose, Bld: 103 mg/dL — ABNORMAL HIGH (ref 65–99)
Potassium: 3.5 mmol/L (ref 3.5–5.1)
Sodium: 141 mmol/L (ref 135–145)
Total Bilirubin: 0.2 mg/dL — ABNORMAL LOW (ref 0.3–1.2)
Total Protein: 7.3 g/dL (ref 6.5–8.1)

## 2017-05-08 LAB — LIPASE, BLOOD: Lipase: 26 U/L (ref 11–51)

## 2017-05-08 LAB — CBC
HCT: 34.2 % — ABNORMAL LOW (ref 36.0–46.0)
Hemoglobin: 11.1 g/dL — ABNORMAL LOW (ref 12.0–15.0)
MCH: 28.3 pg (ref 26.0–34.0)
MCHC: 32.5 g/dL (ref 30.0–36.0)
MCV: 87.2 fL (ref 78.0–100.0)
Platelets: 195 10*3/uL (ref 150–400)
RBC: 3.92 MIL/uL (ref 3.87–5.11)
RDW: 13.1 % (ref 11.5–15.5)
WBC: 4.1 10*3/uL (ref 4.0–10.5)

## 2017-05-08 LAB — URINALYSIS, ROUTINE W REFLEX MICROSCOPIC
Bacteria, UA: NONE SEEN
Bilirubin Urine: NEGATIVE
Glucose, UA: NEGATIVE mg/dL
Hgb urine dipstick: NEGATIVE
Ketones, ur: NEGATIVE mg/dL
Nitrite: NEGATIVE
Protein, ur: NEGATIVE mg/dL
Specific Gravity, Urine: 1.027 (ref 1.005–1.030)
pH: 8 (ref 5.0–8.0)

## 2017-05-08 MED ORDER — IOPAMIDOL (ISOVUE-300) INJECTION 61%
100.0000 mL | Freq: Once | INTRAVENOUS | Status: AC | PRN
  Administered 2017-05-08: 100 mL via INTRAVENOUS

## 2017-05-08 MED ORDER — KETOROLAC TROMETHAMINE 15 MG/ML IJ SOLN
15.0000 mg | Freq: Once | INTRAMUSCULAR | Status: AC
  Administered 2017-05-08: 15 mg via INTRAVENOUS
  Filled 2017-05-08: qty 1

## 2017-05-08 MED ORDER — HYDROMORPHONE HCL 1 MG/ML IJ SOLN
1.0000 mg | Freq: Once | INTRAMUSCULAR | Status: AC
  Administered 2017-05-08: 1 mg via INTRAVENOUS
  Filled 2017-05-08: qty 1

## 2017-05-08 MED ORDER — ONDANSETRON HCL 4 MG/2ML IJ SOLN
4.0000 mg | Freq: Once | INTRAMUSCULAR | Status: AC
  Administered 2017-05-08: 4 mg via INTRAVENOUS
  Filled 2017-05-08: qty 2

## 2017-05-08 MED ORDER — ONDANSETRON 4 MG PO TBDP: 4.0000 mg | ORAL_TABLET | Freq: Three times a day (TID) | ORAL | 0 refills | Status: DC | PRN

## 2017-05-08 MED ORDER — IOPAMIDOL (ISOVUE-300) INJECTION 61%
INTRAVENOUS | Status: AC
  Filled 2017-05-08: qty 100

## 2017-05-08 MED ORDER — NAPROXEN 375 MG PO TABS: 375.0000 mg | ORAL_TABLET | Freq: Two times a day (BID) | ORAL | 0 refills | Status: DC | PRN

## 2017-05-08 MED ORDER — TRAMADOL HCL 50 MG PO TABS: 50.0000 mg | ORAL_TABLET | Freq: Four times a day (QID) | ORAL | 0 refills | Status: DC | PRN

## 2017-05-08 MED ORDER — SODIUM CHLORIDE 0.9 % IV BOLUS (SEPSIS)
1000.0000 mL | Freq: Once | INTRAVENOUS | Status: AC
  Administered 2017-05-08: 1000 mL via INTRAVENOUS

## 2017-05-08 MED ORDER — DEXAMETHASONE SODIUM PHOSPHATE 10 MG/ML IJ SOLN
10.0000 mg | Freq: Once | INTRAMUSCULAR | Status: AC
  Administered 2017-05-08: 10 mg via INTRAVENOUS
  Filled 2017-05-08: qty 1

## 2017-05-08 NOTE — ED Notes (Signed)
Patient is alert and oriented x3.  She was given DC instructions and follow up visit instructions.  Patient gave verbal understanding. She was DC ambulatory under her own power to home.  V/S stable.  He was not showing any signs of distress on DC 

## 2017-05-08 NOTE — ED Provider Notes (Signed)
[facility] DEPT Provider Note   CSN: [id number] Arrival date & time: 05/08/17  0831     History   Chief Complaint Chief Complaint  Patient presents with  . Back Pain  . upper abd pain    HPI Teresa Orr is a 61 y.o. female.  HPI  31-year-old female with lower back pain. Intermittent initially, but now more or less constant within the past day. Initially noticed symptoms about 5 days ago. Denies any discrete trauma, overuse or strain. Pain radiates into both buttocks, right greater than left. No nose or tingling. No weakness. No urinary complaints.  Past Medical History:  Diagnosis Date  . Anxiety   . GERD (gastroesophageal reflux disease)   . Sickle cell trait ([place])   . Status post dilation of esophageal narrowing     Patient Active Problem List   Diagnosis Date Noted  . DIZZINESS 02/06/2008  . BACK PAIN 12/29/2007  . PERIPHERAL EDEMA 12/29/2007  . ABDOMINAL PAIN, LEFT LOWER QUADRANT 12/29/2007  . ANXIETY 04/15/2007  . DEPRESSION 04/15/2007  . INSOMNIA 04/15/2007  . ANEMIA-IRON DEFICIENCY 04/09/2007  . ESOPHAGITIS 04/09/2007  . GERD 04/09/2007  . IBS 04/09/2007  . ALLERGY, HX OF 04/09/2007    Past Surgical History:  Procedure Laterality Date  . CESAREAN SECTION    . COLONOSCOPY      OB History    No data available       Home Medications    Prior to Admission medications   Medication Sig Start Date End Date Taking? Authorizing Provider  amoxicillin (AMOXIL) 875 MG tablet Take 1 tablet (875 mg total) by mouth 2 (two) times daily. 11/04/16   Robyn Haber, MD  butalbital-acetaminophen-caffeine (FIORICET, ESGIC) [phone] MG tablet Take 1-2 tablets by mouth every 6 (six) hours as needed for headache. 11/04/16 11/04/17  Robyn Haber, MD  cetirizine (ZYRTEC) 10 MG tablet Take 1 tablet (10 mg total) by mouth daily. 09/19/15   Sam, Olivia Canter, PA-C  chlorhexidine (PERIDEX) 0.12 % solution Use as directed 15 mLs in the mouth or throat 2 (two) times  daily. 11/04/16   Robyn Haber, MD  Cimetidine (TAGAMET PO) Take by mouth.    [provider]  cyclobenzaprine (FLEXERIL) 5 MG tablet TAKE 1 TABLET BY MOUTH THREE TIMES DAILY AS NEEDED FOR MUSCLE SPASM 01/30/17   Delia Chimes A, MD  fluticasone (FLONASE) 50 MCG/ACT nasal spray Place 2 sprays into both nostrils daily. 08/08/16 08/15/16  Barry Dienes, NP    Family History Family History  Problem Relation Age of Onset  . Adopted: Yes  . Lung cancer Mother   . Cancer Mother   . Diabetes Mother   . Diabetes Sister   . Hyperlipidemia Sister   . Diabetes Brother   . Hyperlipidemia Brother   . Diabetes Other        10 out of 13 siblings  . Hypertension Other        siblings    Social History Social History  Substance Use Topics  . Smoking status: Never Smoker  . Smokeless tobacco: Never Used  . Alcohol use 1.2 oz/week    2 Standard drinks or equivalent per week     Comment: 1 per day     Allergies   Nexium [esomeprazole magnesium] and Tussionex pennkinetic er [hydrocod polst-cpm polst er]   Review of Systems Review of Systems  All systems reviewed and negative, other than as noted in HPI.  Physical Exam Updated Vital Signs BP (!) 155/98   Pulse  86   Temp 98 F (36.7 C) (Oral)   Resp 15   SpO2 100%   Physical Exam  Constitutional: She is oriented to person, place, and time. She appears well-developed and well-nourished. No distress.  HENT:  Head: Normocephalic and atraumatic.  Eyes: Conjunctivae are normal. Right eye exhibits no discharge. Left eye exhibits no discharge.  Neck: Neck supple.  Cardiovascular: Normal rate, regular rhythm and normal heart sounds.  Exam reveals no gallop and no friction rub.   No murmur heard. Pulmonary/Chest: Effort normal and breath sounds normal. No respiratory distress.  Abdominal: Soft. She exhibits no distension. There is no tenderness.  Musculoskeletal: She exhibits no edema or tenderness.  Mild lumbar tenderness but  paraspinally and in the midline. Doesn't particularly lateralize. She is 5 out of 5 bilateral lower extremities. Sensation is intact to light touch. Palpable DP pulses. Positive straight leg test.  Neurological: She is alert and oriented to person, place, and time.  Skin: Skin is warm and dry.  Psychiatric: She has a normal mood and affect. Her behavior is normal. Thought content normal.  Nursing note and vitals reviewed.    ED Treatments / Results  Labs (all labs ordered are listed, but only abnormal results are displayed) Labs Reviewed  COMPREHENSIVE METABOLIC PANEL - Abnormal; Notable for the following:       Result Value   Glucose, Bld 103 (*)    Total Bilirubin 0.2 (*)    All other components within normal limits  CBC - Abnormal; Notable for the following:    Hemoglobin 11.1 (*)    HCT 34.2 (*)    All other components within normal limits  URINALYSIS, ROUTINE W REFLEX MICROSCOPIC - Abnormal; Notable for the following:    Color, Urine STRAW (*)    Leukocytes, UA LARGE (*)    Squamous Epithelial / LPF 0-5 (*)    All other components within normal limits  LIPASE, BLOOD    EKG  EKG Interpretation None       Radiology No results found.  Procedures Procedures (including critical care time)  Medications Ordered in ED Medications  iopamidol (ISOVUE-300) 61 % injection (not administered)  sodium chloride 0.9 % bolus 1,000 mL (1,000 mLs Intravenous New Bag/Given 05/08/17 1135)  ketorolac (TORADOL) 15 MG/ML injection 15 mg (15 mg Intravenous Given 05/08/17 1135)  HYDROmorphone (DILAUDID) injection 1 mg (1 mg Intravenous Given 05/08/17 1135)  dexamethasone (DECADRON) injection 10 mg (10 mg Intravenous Given 05/08/17 1135)  iopamidol (ISOVUE-300) 61 % injection 100 mL (100 mLs Intravenous Contrast Given 05/08/17 1228)     Initial Impression / Assessment and Plan / ED Course  I have reviewed the triage vital signs and the nursing notes.  Pertinent labs & imaging results  that were available during my care of the patient were reviewed by me and considered in my medical decision making (see chart for details).     61 year old female with lower back pain without overt red flags. Reassuring exam. Plans to medical treatment. Return cautions discussed.  Final Clinical Impressions(s) / ED Diagnoses   Final diagnoses:  Acute midline low back pain with right-sided sciatica  Periumbilical abdominal pain    New Prescriptions New Prescriptions   No medications on file     Virgel Manifold, MD 05/12/17 1052

## 2017-05-08 NOTE — ED Triage Notes (Signed)
Patient reports intermittent lower back pain that radiates to buttock area and then also upper abd pain for 5 days. Patient denies n/v/d or urinary problems.

## 2017-06-07 DIAGNOSIS — R350 Frequency of micturition: Secondary | ICD-10-CM | POA: Diagnosis not present

## 2018-05-06 DIAGNOSIS — Z1231 Encounter for screening mammogram for malignant neoplasm of breast: Secondary | ICD-10-CM | POA: Diagnosis not present

## 2018-05-16 DIAGNOSIS — K625 Hemorrhage of anus and rectum: Secondary | ICD-10-CM | POA: Diagnosis not present

## 2018-05-16 DIAGNOSIS — M545 Low back pain: Secondary | ICD-10-CM | POA: Diagnosis not present

## 2018-05-16 DIAGNOSIS — Z113 Encounter for screening for infections with a predominantly sexual mode of transmission: Secondary | ICD-10-CM | POA: Diagnosis not present

## 2018-05-16 DIAGNOSIS — N898 Other specified noninflammatory disorders of vagina: Secondary | ICD-10-CM | POA: Diagnosis not present

## 2018-05-30 ENCOUNTER — Encounter: Payer: Self-pay | Admitting: Physician Assistant

## 2018-05-30 ENCOUNTER — Ambulatory Visit: Payer: BLUE CROSS/BLUE SHIELD | Admitting: Physician Assistant

## 2018-05-30 VITALS — BP 116/80 | HR 72 | Ht 65.5 in | Wt 149.2 lb

## 2018-05-30 DIAGNOSIS — K625 Hemorrhage of anus and rectum: Secondary | ICD-10-CM | POA: Diagnosis not present

## 2018-05-30 DIAGNOSIS — R194 Change in bowel habit: Secondary | ICD-10-CM | POA: Diagnosis not present

## 2018-05-30 DIAGNOSIS — Z86010 Personal history of colonic polyps: Secondary | ICD-10-CM | POA: Diagnosis not present

## 2018-05-30 DIAGNOSIS — M7918 Myalgia, other site: Secondary | ICD-10-CM | POA: Diagnosis not present

## 2018-05-30 MED ORDER — NA SULFATE-K SULFATE-MG SULF 17.5-3.13-1.6 GM/177ML PO SOLN: ORAL | 0 refills | Status: DC

## 2018-05-30 NOTE — Progress Notes (Addendum)
Chief Complaint: Blood in stool  HPI:    Teresa Orr is a 62 year old female with a past medical history as listed below, known to Dr. Hilarie Fredrickson, who was referred to me by Forrest Moron, MD for a complaint of rectal bleeding.      12/26/2013 office visit with Dr. Hilarie Fredrickson to discuss change in bowel habits and to consider rectal cancer screening.  At that time it was recommended she have a colonoscopy.  Labs were checked including CBC, CMP and TSH.  She had some lower back pain which she was asked to discuss with her PCP.    01/04/2014 colonoscopy with 2 polyps ranging between 3-5 mm in size in the transverse colon and otherwise normal.  Pathology showed tubular adenomas.  Repeat was recommended in 5 years.    05/08/2017 patient seen in the ED for back pain/upper abdominal pain.  Labs showed a CMP with an elevated glucose of 103 and otherwise normal, CBC with a hemoglobin 11.1.  Urinalysis and lipase normal.  She was given Toradol and Dilaudid and sent home.  CT abdomen pelvis was negative.    Today, the patient presents to clinic and explains that over the past month she has had 2 episodes of bright red blood on the toilet paper which "got worse when I wiped".  Has also noted a change in her bowel habits towards constipation.  Currently using a vegetable laxative over the past few weeks which has helped slightly.  Most concerning to her is that "my rectum does not feel normal", she can not describe this further.  Patient tells me that she feels a pain in her buttocks which sometimes radiates down into her leg as well as a numbness at times-worse when she is sitting.  Recently saw her gynecologist who told her to follow with [place] in regards to the symptoms.    Patient does not have a PCP.    Denies fever, chills, weight loss, anorexia, nausea, vomiting, heartburn, reflux or symptoms that awaken her from sleep.  Past Medical History:  Diagnosis Date  . Anxiety   . GERD (gastroesophageal reflux disease)     . Sickle cell trait ([place])   . Status post dilation of esophageal narrowing     Past Surgical History:  Procedure Laterality Date  . CESAREAN SECTION    . COLONOSCOPY      Current Outpatient Medications  Medication Sig Dispense Refill  . amoxicillin (AMOXIL) 875 MG tablet Take 1 tablet (875 mg total) by mouth 2 (two) times daily. 20 tablet 0  . cetirizine (ZYRTEC) 10 MG tablet Take 1 tablet (10 mg total) by mouth daily. 30 tablet 0  . chlorhexidine (PERIDEX) 0.12 % solution Use as directed 15 mLs in the mouth or throat 2 (two) times daily. 120 mL 0  . Cimetidine (TAGAMET PO) Take by mouth.    . cyclobenzaprine (FLEXERIL) 5 MG tablet TAKE 1 TABLET BY MOUTH THREE TIMES DAILY AS NEEDED FOR MUSCLE SPASM 30 tablet 1  . fluticasone (FLONASE) 50 MCG/ACT nasal spray Place 2 sprays into both nostrils daily. 9.9 g 1  . naproxen (NAPROSYN) 375 MG tablet Take 1 tablet (375 mg total) by mouth 2 (two) times daily as needed. 20 tablet 0  . ondansetron (ZOFRAN ODT) 4 MG disintegrating tablet Take 1 tablet (4 mg total) by mouth every 8 (eight) hours as needed for nausea or vomiting. 12 tablet 0  . traMADol (ULTRAM) 50 MG tablet Take 1 tablet (50 mg total) by  mouth every 6 (six) hours as needed. 12 tablet 0   No current facility-administered medications for this visit.     Allergies as of 05/30/2018 - Review Complete 05/08/2017  Allergen Reaction Noted  . Nexium [esomeprazole magnesium] Itching 12/26/2013  . Tussionex pennkinetic er [hydrocod polst-cpm polst er] Itching 01/22/2016    Family History  Adopted: Yes  Problem Relation Age of Onset  . Lung cancer Mother   . Cancer Mother   . Diabetes Mother   . Diabetes Sister   . Hyperlipidemia Sister   . Diabetes Brother   . Hyperlipidemia Brother   . Diabetes Other        10 out of 13 siblings  . Hypertension Other        siblings    Social History   Socioeconomic History  . Marital status: Single    Spouse name: Not on file  . Number  of children: 3  . Years of education: Not on file  . Highest education level: Not on file  Occupational History    Employer: [employer]  . Financial resource strain: Not on file  . Food insecurity:    Worry: Not on file    Inability: Not on file  . Transportation needs:    Medical: Not on file    Non-medical: Not on file  Tobacco Use  . Smoking status: Never Smoker  . Smokeless tobacco: Never Used  Substance and Sexual Activity  . Alcohol use: Yes    Alcohol/week: 2.0 standard drinks    Types: 2 Standard drinks or equivalent per week    Comment: 1 per day  . Drug use: No  . Sexual activity: Not on file  Lifestyle  . Physical activity:    Days per week: Not on file    Minutes per session: Not on file  . Stress: Not on file  Relationships  . Social connections:    Talks on phone: Not on file    Gets together: Not on file    Attends religious service: Not on file    Active member of club or organization: Not on file    Attends meetings of clubs or organizations: Not on file    Relationship status: Not on file  . Intimate partner violence:    Fear of current or ex partner: Not on file    Emotionally abused: Not on file    Physically abused: Not on file    Forced sexual activity: Not on file  Other Topics Concern  . Not on file  Social History Narrative  . Not on file    Review of Systems:    Constitutional: No weight loss, fever or chills Skin: No rash  Cardiovascular: No chest pain Respiratory: No SOB  Gastrointestinal: See HPI and otherwise negative Genitourinary: No dysuria or change in urinary frequency Neurological: No headache, dizziness or syncope Musculoskeletal: No new muscle or joint pain Hematologic: No bruising Psychiatric: No history of depression or anxiety   Physical Exam:  Vital signs: BP 116/80   Pulse 72   Ht 5' 5.5" (1.664 m)   Wt 149 lb 4 oz (67.7 kg)   BMI 24.46 kg/m    Constitutional:   Pleasant AA female appears  to be in NAD, Well developed, Well nourished, alert and cooperative Head:  Normocephalic and atraumatic. Eyes:   PEERL, EOMI. No icterus. Conjunctiva pink. Ears:  Normal auditory acuity. Neck:  Supple Throat: Oral cavity and pharynx without inflammation, swelling or  lesion.  Respiratory: Respirations even and unlabored. Lungs clear to auscultation bilaterally.   No wheezes, crackles, or rhonchi.  Cardiovascular: Normal S1, S2. No MRG. Regular rate and rhythm. No peripheral edema, cyanosis or pallor.  Gastrointestinal:  Soft, nondistended, nontender. No rebound or guarding. Normal bowel sounds. No appreciable masses or hepatomegaly. Rectal:  Not performed.  Msk:  Symmetrical without gross deformities. Without edema, no deformity or joint abnormality.  Neurologic:  Alert and  oriented x4;  grossly normal neurologically.  Skin:   Dry and intact without significant lesions or rashes. Psychiatric: Demonstrates good judgement and reason without abnormal affect or behaviors.  No recent labs or imaging.  Assessment: 1.  Change in bowel habits: Towards constipation over the past month, some better with a vegetable laxative 2.  Rectal bleeding: 2 episodes over the past few weeks, also which she did bowel habits towards constipation; consider hemorrhoids versus other 3.  History of medial adenomatous: Last colonoscopy in 12/2013 with recommendations for repeat 5 years 4.  Lower back/buttock pain: Patient describes numbness had some shooting pain, consider sciatica  Plan: 1.  Discussed with patient that as though a lot of her buttock pain/numbness and radiating sharp pain down her leg to be related to nerve pain/sciatica.  Recommend she follow with her PCP in regards to this.  Tells me she does not have a PCP and so a referral was sent. 2.  Patient is nervous in regards to change in bowel habits and rectal bleeding.  She does have a history of tubular adenomas and requested her colonoscopy done a little  early.  Schedule patient for colonoscopy in the [facility] with Dr. Hilarie Fredrickson.  Did discuss risk, benefits, limitations alternatives the patient agrees to proceed. 3.  Patient does ask about cost regards to colonoscopy.  Discussed with her which she can call her insurance company and ask how much a diagnostic colonoscopy would be for change in bowel habits or rectal bleeding. 4.  Patient to follow clinic for recommendations after time of colonoscopy.  Ellouise Newer, PA-C [facility] 05/30/2018, 10:06 AM  Cc: Forrest Moron, MD   Addendum: Reviewed and agree with assessment and management plan. Pyrtle, Lajuan Lines, MD

## 2018-05-30 NOTE — Patient Instructions (Signed)
Information for the insurance company: 1. Change in bowel habits- R19.4 2. Rectal bleeding- K62.5 3. History of tubular adenomas (polyps). [address]  [phone]  Dr. Lynita Lombard Dr. Ruben Reason Dr. Letta Median, Wed, Thurs, and Fridays only.   [facility] [facility].   You have been scheduled for a colonoscopy. Please follow written instructions given to you at your visit today.  Please pick up your prep supplies at the pharmacy within the next 1-3 days. If you use inhalers (even only as needed), please bring them with you on the day of your procedure.

## 2018-07-01 ENCOUNTER — Encounter: Payer: BLUE CROSS/BLUE SHIELD | Admitting: Internal Medicine

## 2018-12-05 DIAGNOSIS — R05 Cough: Secondary | ICD-10-CM | POA: Diagnosis not present

## 2018-12-05 DIAGNOSIS — R0602 Shortness of breath: Secondary | ICD-10-CM | POA: Diagnosis not present

## 2018-12-05 DIAGNOSIS — Z20828 Contact with and (suspected) exposure to other viral communicable diseases: Secondary | ICD-10-CM | POA: Diagnosis not present

## 2018-12-21 DIAGNOSIS — R350 Frequency of micturition: Secondary | ICD-10-CM | POA: Diagnosis not present

## 2018-12-21 DIAGNOSIS — Z130 Encounter for screening for diseases of the blood and blood-forming organs and certain disorders involving the immune mechanism: Secondary | ICD-10-CM | POA: Diagnosis not present

## 2018-12-21 DIAGNOSIS — Z01419 Encounter for gynecological examination (general) (routine) without abnormal findings: Secondary | ICD-10-CM | POA: Diagnosis not present

## 2018-12-21 DIAGNOSIS — Z1389 Encounter for screening for other disorder: Secondary | ICD-10-CM | POA: Diagnosis not present

## 2018-12-21 DIAGNOSIS — Z124 Encounter for screening for malignant neoplasm of cervix: Secondary | ICD-10-CM | POA: Diagnosis not present

## 2018-12-28 ENCOUNTER — Encounter: Payer: Self-pay | Admitting: Internal Medicine

## 2019-02-14 DIAGNOSIS — E1140 Type 2 diabetes mellitus with diabetic neuropathy, unspecified: Secondary | ICD-10-CM | POA: Diagnosis not present

## 2019-02-14 DIAGNOSIS — E1159 Type 2 diabetes mellitus with other circulatory complications: Secondary | ICD-10-CM | POA: Diagnosis not present

## 2019-02-14 DIAGNOSIS — I10 Essential (primary) hypertension: Secondary | ICD-10-CM | POA: Diagnosis not present

## 2019-02-14 DIAGNOSIS — E1169 Type 2 diabetes mellitus with other specified complication: Secondary | ICD-10-CM | POA: Diagnosis not present

## 2019-02-14 DIAGNOSIS — E782 Mixed hyperlipidemia: Secondary | ICD-10-CM | POA: Diagnosis not present

## 2019-02-14 DIAGNOSIS — F418 Other specified anxiety disorders: Secondary | ICD-10-CM | POA: Diagnosis not present

## 2019-02-28 ENCOUNTER — Encounter: Payer: Self-pay | Admitting: Internal Medicine

## 2019-03-02 DIAGNOSIS — E1165 Type 2 diabetes mellitus with hyperglycemia: Secondary | ICD-10-CM | POA: Diagnosis not present

## 2019-03-02 DIAGNOSIS — E1140 Type 2 diabetes mellitus with diabetic neuropathy, unspecified: Secondary | ICD-10-CM | POA: Diagnosis not present

## 2019-03-15 DIAGNOSIS — H43399 Other vitreous opacities, unspecified eye: Secondary | ICD-10-CM | POA: Diagnosis not present

## 2019-03-28 ENCOUNTER — Other Ambulatory Visit: Payer: Self-pay

## 2019-03-28 ENCOUNTER — Ambulatory Visit (AMBULATORY_SURGERY_CENTER): Payer: Self-pay

## 2019-03-28 VITALS — Ht 65.5 in | Wt 147.4 lb

## 2019-03-28 DIAGNOSIS — K625 Hemorrhage of anus and rectum: Secondary | ICD-10-CM

## 2019-03-28 DIAGNOSIS — Z86010 Personal history of colonic polyps: Secondary | ICD-10-CM

## 2019-03-28 HISTORY — PX: COLONOSCOPY: SHX174

## 2019-03-28 MED ORDER — NA SULFATE-K SULFATE-MG SULF 17.5-3.13-1.6 GM/177ML PO SOLN: 1.0000 | Freq: Once | ORAL | 0 refills | Status: AC

## 2019-03-28 NOTE — Progress Notes (Signed)
Denies allergies to eggs or soy products. Denies complication of anesthesia or sedation. Denies use of weight loss medication. Denies use of O2.   Emmi instructions given for colonoscopy.  A 15.00 coupon for Suprep was given to the patient.  

## 2019-03-31 ENCOUNTER — Encounter: Payer: Self-pay | Admitting: Internal Medicine

## 2019-04-10 ENCOUNTER — Encounter: Payer: Self-pay | Admitting: Internal Medicine

## 2019-04-10 ENCOUNTER — Other Ambulatory Visit: Payer: Self-pay

## 2019-04-10 ENCOUNTER — Ambulatory Visit (AMBULATORY_SURGERY_CENTER): Payer: BC Managed Care – PPO | Admitting: Internal Medicine

## 2019-04-10 VITALS — BP 121/74 | HR 75 | Temp 98.3°F | Resp 12 | Ht 65.0 in | Wt 147.0 lb

## 2019-04-10 DIAGNOSIS — D124 Benign neoplasm of descending colon: Secondary | ICD-10-CM

## 2019-04-10 DIAGNOSIS — Z1211 Encounter for screening for malignant neoplasm of colon: Secondary | ICD-10-CM

## 2019-04-10 DIAGNOSIS — Z86010 Personal history of colonic polyps: Secondary | ICD-10-CM | POA: Diagnosis not present

## 2019-04-10 DIAGNOSIS — D120 Benign neoplasm of cecum: Secondary | ICD-10-CM | POA: Diagnosis not present

## 2019-04-10 DIAGNOSIS — D122 Benign neoplasm of ascending colon: Secondary | ICD-10-CM | POA: Diagnosis not present

## 2019-04-10 MED ORDER — SODIUM CHLORIDE 0.9 % IV SOLN: 500.0000 mL | Freq: Once | INTRAVENOUS | Status: DC

## 2019-04-10 NOTE — Progress Notes (Signed)
Pt's states no medical or surgical changes since previsit or office visit.  No egg or soy allergy  

## 2019-04-10 NOTE — Progress Notes (Signed)
PT taken to PACU. Monitors in place. VSS. Report given to RN. 

## 2019-04-10 NOTE — Progress Notes (Signed)
Called to room to assist during endoscopic procedure.  Patient ID and intended procedure confirmed with present staff. Received instructions for my participation in the procedure from the performing physician.  

## 2019-04-10 NOTE — Patient Instructions (Signed)
Thank you for allowing us to care for you today!  Await pathology results by mail, approximately 2 weeks.  Recommendation for next colonoscopy will be made at that time.  Resume previous diet and medications today.  Return to your normal activities tomorrow.      YOU HAD AN ENDOSCOPIC PROCEDURE TODAY AT THE [facility]:   Refer to the procedure report that was given to you for any specific questions about what was found during the examination.  If the procedure report does not answer your questions, please call your gastroenterologist to clarify.  If you requested that your care partner not be given the details of your procedure findings, then the procedure report has been included in a sealed envelope for you to review at your convenience later.  YOU SHOULD EXPECT: Some feelings of bloating in the abdomen. Passage of more gas than usual.  Walking can help get rid of the air that was put into your GI tract during the procedure and reduce the bloating. If you had a lower endoscopy (such as a colonoscopy or flexible sigmoidoscopy) you may notice spotting of blood in your stool or on the toilet paper. If you underwent a bowel prep for your procedure, you may not have a normal bowel movement for a few days.  Please Note:  You might notice some irritation and congestion in your nose or some drainage.  This is from the oxygen used during your procedure.  There is no need for concern and it should clear up in a day or so.  SYMPTOMS TO REPORT IMMEDIATELY:   Following lower endoscopy (colonoscopy or flexible sigmoidoscopy):  Excessive amounts of blood in the stool  Significant tenderness or worsening of abdominal pains  Swelling of the abdomen that is new, acute  Fever of 100F or higher    For urgent or emergent issues, a gastroenterologist can be reached at any hour by calling [phone].   DIET:  We do recommend a small meal at first, but then you may proceed to your regular  diet.  Drink plenty of fluids but you should avoid alcoholic beverages for 24 hours.  ACTIVITY:  You should plan to take it easy for the rest of today and you should NOT DRIVE or use heavy machinery until tomorrow (because of the sedation medicines used during the test).    FOLLOW UP: Our staff will call the number listed on your records 48-72 hours following your procedure to check on you and address any questions or concerns that you may have regarding the information given to you following your procedure. If we do not reach you, we will leave a message.  We will attempt to reach you two times.  During this call, we will ask if you have developed any symptoms of COVID 19. If you develop any symptoms (ie: fever, flu-like symptoms, shortness of breath, cough etc.) before then, please call [phone].  If you test positive for Covid 19 in the 2 weeks post procedure, please call and report this information to us.    If any biopsies were taken you will be contacted by phone or by letter within the next 1-3 weeks.  Please call us at [phone] if you have not heard about the biopsies in 3 weeks.    SIGNATURES/CONFIDENTIALITY: You and/or your care partner have signed paperwork which will be entered into your electronic medical record.  These signatures attest to the fact that that the information above on your After Visit   been reviewed and is understood.  Full responsibility of the confidentiality of this discharge information lies with you and/or your care-partner.

## 2019-04-10 NOTE — Op Note (Signed)
[facility] Patient Name: Teresa Orr Procedure Date: 04/10/2019 9:58 AM MRN: [id number] Endoscopist: Jerene Bears , MD Age: 63 Referring MD:  Date of Birth: Aug 20, 1955 Gender: Female Account #: [id number] Procedure:                Colonoscopy Indications:              High risk colon cancer surveillance: Personal                            history of non-advanced adenoma, Last colonoscopy:                            June 2015 Medicines:                Monitored Anesthesia Care Procedure:                Pre-Anesthesia Assessment:                           - Prior to the procedure, a History and Physical                            was performed, and patient medications and                            allergies were reviewed. The patient's tolerance of                            previous anesthesia was also reviewed. The risks                            and benefits of the procedure and the sedation                            options and risks were discussed with the patient.                            All questions were answered, and informed consent                            was obtained. Prior Anticoagulants: The patient has                            taken no previous anticoagulant or antiplatelet                            agents. ASA Grade Assessment: II - A patient with                            mild systemic disease. After reviewing the risks                            and benefits, the patient was deemed in  satisfactory condition to undergo the procedure.                           After obtaining informed consent, the colonoscope                            was passed under direct vision. Throughout the                            procedure, the patient's blood pressure, pulse, and                            oxygen saturations were monitored continuously. The                            Colonoscope was introduced through the anus and                             advanced to the cecum, identified by appendiceal                            orifice and ileocecal valve. The colonoscopy was                            performed without difficulty. The patient tolerated                            the procedure well. The quality of the bowel                            preparation was good. The ileocecal valve,                            appendiceal orifice, and rectum were photographed. Scope In: 10:00:38 AM Scope Out: 10:16:25 AM Scope Withdrawal Time: 0 hours 13 minutes 15 seconds  Total Procedure Duration: 0 hours 15 minutes 47 seconds  Findings:                 The digital rectal exam was normal.                           A 2 mm polyp was found in the cecum. The polyp was                            sessile. The polyp was removed with a cold snare.                            Resection and retrieval were complete.                           A 3 mm polyp was found in the ascending colon. The                            polyp was sessile.  The polyp was removed with a                            cold snare. Resection and retrieval were complete.                           A 5 mm polyp was found in the descending colon. The                            polyp was sessile. The polyp was removed with a                            cold snare. Resection and retrieval were complete.                           A few small-mouthed diverticula were found in the                            sigmoid colon.                           Internal hemorrhoids were found during                            retroflexion. The hemorrhoids were small. Complications:            No immediate complications. Estimated Blood Loss:     Estimated blood loss was minimal. Impression:               - One 2 mm polyp in the cecum, removed with a cold                            snare. Resected and retrieved.                           - One 3 mm polyp in the ascending colon, removed                             with a cold snare. Resected and retrieved.                           - One 5 mm polyp in the descending colon, removed                            with a cold snare. Resected and retrieved.                           - Diverticulosis in the sigmoid colon.                           - Small internal hemorrhoids. Recommendation:           - Patient has a contact number available for  emergencies. The signs and symptoms of potential                            delayed complications were discussed with the                            patient. Return to normal activities tomorrow.                            Written discharge instructions were provided to the                            patient.                           - Resume previous diet.                           - Continue present medications.                           - Await pathology results.                           - Repeat colonoscopy is recommended for                            surveillance. The colonoscopy date will be                            determined after pathology results from today's                            exam become available for review. Jerene Bears, MD 04/10/2019 10:20:02 AM This report has been signed electronically.

## 2019-04-12 ENCOUNTER — Encounter: Payer: Self-pay | Admitting: Internal Medicine

## 2019-04-12 ENCOUNTER — Telehealth: Payer: Self-pay

## 2019-04-12 ENCOUNTER — Telehealth: Payer: Self-pay | Admitting: *Deleted

## 2019-04-12 NOTE — Telephone Encounter (Signed)
  Follow up Call-  Call back number 04/10/2019  Post procedure Call Back phone  # [phone]  Permission to leave phone message Yes  Some recent data might be hidden     Patient questions:  Do you have a fever, pain , or abdominal swelling? No. Pain Score  0 *  Have you tolerated food without any problems? Yes.    Have you been able to return to your normal activities? Yes.    Do you have any questions about your discharge instructions: Diet   No. Medications  No. Follow up visit  No.  Do you have questions or concerns about your Care? No.  Actions: * If pain score is 4 or above: No action needed, pain <4.  1. Have you developed a fever since your procedure? No.  2.   Have you had an respiratory symptoms (SOB or cough) since your procedure? No.  3.   Have you tested positive for COVID 19 since your procedure no.  4.   Have you had any family members/close contacts diagnosed with the COVID 19 since your procedure?  No.   If yes to any of these questions please route to Joylene John, RN and Alphonsa Gin, RN.

## 2019-04-12 NOTE — Telephone Encounter (Signed)
First follow up call attempt. Message left on voicemail to call if any questions or concerns regarding procedure or covid19.

## 2019-04-17 DIAGNOSIS — R300 Dysuria: Secondary | ICD-10-CM | POA: Diagnosis not present

## 2019-04-17 DIAGNOSIS — E1140 Type 2 diabetes mellitus with diabetic neuropathy, unspecified: Secondary | ICD-10-CM | POA: Diagnosis not present

## 2019-04-17 DIAGNOSIS — N3000 Acute cystitis without hematuria: Secondary | ICD-10-CM | POA: Diagnosis not present

## 2019-05-17 DIAGNOSIS — E1159 Type 2 diabetes mellitus with other circulatory complications: Secondary | ICD-10-CM | POA: Diagnosis not present

## 2019-05-17 DIAGNOSIS — I10 Essential (primary) hypertension: Secondary | ICD-10-CM | POA: Diagnosis not present

## 2019-05-17 DIAGNOSIS — E1140 Type 2 diabetes mellitus with diabetic neuropathy, unspecified: Secondary | ICD-10-CM | POA: Diagnosis not present

## 2019-05-17 DIAGNOSIS — E1169 Type 2 diabetes mellitus with other specified complication: Secondary | ICD-10-CM | POA: Diagnosis not present

## 2019-05-17 DIAGNOSIS — Z1231 Encounter for screening mammogram for malignant neoplasm of breast: Secondary | ICD-10-CM | POA: Diagnosis not present

## 2019-06-20 ENCOUNTER — Other Ambulatory Visit: Payer: Self-pay

## 2019-06-20 DIAGNOSIS — Z20822 Contact with and (suspected) exposure to covid-19: Secondary | ICD-10-CM

## 2019-06-22 LAB — NOVEL CORONAVIRUS, NAA: SARS-CoV-2, NAA: NOT DETECTED

## 2019-12-08 ENCOUNTER — Ambulatory Visit: Payer: BC Managed Care – PPO | Admitting: Pulmonary Disease

## 2019-12-08 ENCOUNTER — Other Ambulatory Visit: Payer: Self-pay

## 2019-12-08 ENCOUNTER — Encounter: Payer: Self-pay | Admitting: Pulmonary Disease

## 2019-12-08 DIAGNOSIS — R059 Cough, unspecified: Secondary | ICD-10-CM

## 2019-12-08 DIAGNOSIS — R05 Cough: Secondary | ICD-10-CM

## 2019-12-08 MED ORDER — CHLORPHENIRAMINE MALEATE 4 MG PO TABS: 8.0000 mg | ORAL_TABLET | Freq: Three times a day (TID) | ORAL | 2 refills | Status: DC

## 2019-12-08 MED ORDER — OMEPRAZOLE 40 MG PO CPDR: 40.0000 mg | DELAYED_RELEASE_CAPSULE | Freq: Every day | ORAL | 2 refills | Status: DC

## 2019-12-08 NOTE — Progress Notes (Signed)
Teresa Rolfe    [id number]    April 15, 1956  Primary Care Physician:Henley, Marcello Moores, MD  Referring Physician: Newton Pigg, MD [place],   [id number]  Chief complaint: Consult for chronic cough  HPI: 64 year old with history of allergies, sinus disease, GERD, esophageal stricture status post dilatation Complains of chronic cough for the past 6 months.  Has a feeling of mucus stuck at the back of the throat and postnasal drip.  She is concerned about any lung issues given family history of lung cancer and wants her lungs checked out  Has history of acid reflux for which she uses cimetidine occasionally.  She has had esophageal dilatation performed many years ago by Dr. Velora Heckler.  Currently follows with Dr. Hilarie Fredrickson. She saw an ENT doctor at [facility] around April 2021.  She was told that the laryngoscopy was normal and was given a course of antibiotic and steroid which helped minimally with her symptoms.   Pets: No pets Occupation: Works for [employer] Exposures: No known exposures.  No mold, hot tub, Jacuzzi.  No down pillows or comforters Smoking history: Never smoker Travel history: No significant travel history Relevant family history: Mother had lung cancer.  She was a smoker.  Outpatient Encounter Medications as of 12/08/2019  Medication Sig  . cetirizine (ZYRTEC) 10 MG tablet Take 1 tablet (10 mg total) by mouth daily.  . Cimetidine (TAGAMET PO) Take by mouth.  . dextromethorphan-guaiFENesin (MUCINEX DM) 30-600 MG 12hr tablet Take 1 tablet by mouth once.  . fluticasone (FLONASE) 50 MCG/ACT nasal spray Place 2 sprays into both nostrils daily.  Marland Kitchen ibuprofen (ADVIL,MOTRIN) 800 MG tablet Take 800 mg by mouth as needed.   No facility-administered encounter medications on file as of 12/08/2019.    Allergies as of 12/08/2019 - Review Complete 12/08/2019  Allergen Reaction Noted  . Nexium [esomeprazole magnesium] Itching  12/26/2013  . Tussionex pennkinetic er [hydrocod polst-cpm polst er] Itching 01/22/2016    Past Medical History:  Diagnosis Date  . Allergy   . Anxiety   . GERD (gastroesophageal reflux disease)   . Sickle cell trait ([place])   . Status post dilation of esophageal narrowing     Past Surgical History:  Procedure Laterality Date  . CESAREAN SECTION    . COLONOSCOPY      Family History  Adopted: Yes  Problem Relation Age of Onset  . Lung cancer Mother   . Cancer Mother   . Diabetes Mother   . Diabetes Sister   . Hyperlipidemia Sister   . Colon polyps Sister   . Diabetes Brother   . Hyperlipidemia Brother   . Diabetes Other        10 out of 13 siblings  . Hypertension Other        siblings  . Colon cancer Neg Hx   . Esophageal cancer Neg Hx   . Rectal cancer Neg Hx   . Stomach cancer Neg Hx     Social History   Socioeconomic History  . Marital status: Single    Spouse name: Not on file  . Number of children: 3  . Years of education: Not on file  . Highest education level: Not on file  Occupational History    Employer: [employer]  Tobacco Use  . Smoking status: Never Smoker  . Smokeless tobacco: Never Used  Substance and Sexual Activity  . Alcohol use: Yes    Alcohol/week: 2.0 standard  drinks    Types: 2 Standard drinks or equivalent per week    Comment: 1 per day  . Drug use: No  . Sexual activity: Not on file  Other Topics Concern  . Not on file  Social History Narrative  . Not on file   Social Determinants of Health   Financial Resource Strain:   . Difficulty of Paying Living Expenses:   Food Insecurity:   . Worried About Charity fundraiser in the Last Year:   . Arboriculturist in the Last Year:   Transportation Needs:   . Film/video editor (Medical):   Marland Kitchen Lack of Transportation (Non-Medical):   Physical Activity:   . Days of Exercise per Week:   . Minutes of Exercise per Session:   Stress:   . Feeling of Stress :   Social  Connections:   . Frequency of Communication with Friends and Family:   . Frequency of Social Gatherings with Friends and Family:   . Attends Religious Services:   . Active Member of Clubs or Organizations:   . Attends Archivist Meetings:   Marland Kitchen Marital Status:   Intimate Partner Violence:   . Fear of Current or Ex-Partner:   . Emotionally Abused:   Marland Kitchen Physically Abused:   . Sexually Abused:     Review of systems: Review of Systems  Constitutional: Negative for fever and chills.  HENT: Negative.   Eyes: Negative for blurred vision.  Respiratory: as per HPI  Cardiovascular: Negative for chest pain and palpitations.  Gastrointestinal: Negative for vomiting, diarrhea, blood per rectum. Genitourinary: Negative for dysuria, urgency, frequency and hematuria.  Musculoskeletal: Negative for myalgias, back pain and joint pain.  Skin: Negative for itching and rash.  Neurological: Negative for dizziness, tremors, focal weakness, seizures and loss of consciousness.  Endo/Heme/Allergies: Negative for environmental allergies.  Psychiatric/Behavioral: Negative for depression, suicidal ideas and hallucinations.  All other systems reviewed and are negative.  Physical Exam: Blood pressure 138/64, pulse 80, temperature (!) 97.3 F (36.3 C), temperature source Temporal, height 5' 5.5" (1.664 m), weight 153 lb (69.4 kg), SpO2 99 %. Gen:      No acute distress HEENT:  EOMI, sclera anicteric Neck:     No masses; no thyromegaly Lungs:    Clear to auscultation bilaterally; normal respiratory effort CV:         Regular rate and rhythm; no murmurs Abd:      + bowel sounds; soft, non-tender; no palpable masses, no distension Ext:    No edema; adequate peripheral perfusion Skin:      Warm and dry; no rash Neuro: alert and oriented x 3 Psych: normal mood and affect  Data Reviewed: Imaging: CT abdomen pelvis 05/08/2017-basilar opacities, dependent atelectasis.  I have reviewed the images  personally.  Labs: 01/14/2016-WBC 3.7, eos 2%, absolute eosinophil count 74    Assessment:  Chronic cough Likely upper airway cough, acid reflux. She is already on Flonase.  I will add first-generation antihistamine with chlorpheniramine 8 mg 3 times daily. Start omeprazole 40 mg daily for acid reflux  She is concerned about lung issues including lung cancer.  I reassured her but given significant concern and prior CT showing some mild basal opacities it would be reasonable to proceed with high-resolution CT and pulmonary function test.  MDM Reviewed imaging, labs, gastroenterology and primary care notes.  Plan/Recommendations: HRCT with supine and prone images, PFTs Flonase, chlorphentermine, omeprazole  Marshell Garfinkel MD [facility]  12/08/2019, 10:42 AM  CC: Newton Pigg, MD

## 2019-12-08 NOTE — Patient Instructions (Signed)
We will schedule you for high-resolution CT and pulmonary function test for better evaluation of your lungs Clear postnasal drip continue using the Flonase We will send in a prescription for chlorpheniramine 8 mg 3 times daily.  This is also available over-the-counter.  Please check with the pharmacist about the cheapest option available to get this medication  Start omeprazole 40 mg a day for acid reflux Follow-up in 1 to 2 months.

## 2019-12-08 NOTE — Progress Notes (Deleted)
Teresa Lohmeier    [id number]    03/08/1956  Primary Care Physician:Henley, Marcello Moores, MD  Referring Physician: Newton Pigg, MD [place],  [place] [id number]  Chief complaint:  ***  HPI:  ***  Pets: Occupation: Exposures: Smoking history: Travel history: Relevant family history:   Outpatient Encounter Medications as of 12/08/2019  Medication Sig  . cetirizine (ZYRTEC) 10 MG tablet Take 1 tablet (10 mg total) by mouth daily.  . Cimetidine (TAGAMET PO) Take by mouth.  . dextromethorphan-guaiFENesin (MUCINEX DM) 30-600 MG 12hr tablet Take 1 tablet by mouth once.  . fluticasone (FLONASE) 50 MCG/ACT nasal spray Place 2 sprays into both nostrils daily.  Marland Kitchen ibuprofen (ADVIL,MOTRIN) 800 MG tablet Take 800 mg by mouth as needed.   No facility-administered encounter medications on file as of 12/08/2019.    Allergies as of 12/08/2019 - Review Complete 12/08/2019  Allergen Reaction Noted  . Nexium [esomeprazole magnesium] Itching 12/26/2013  . Tussionex pennkinetic er [hydrocod polst-cpm polst er] Itching 01/22/2016    Past Medical History:  Diagnosis Date  . Allergy   . Anxiety   . GERD (gastroesophageal reflux disease)   . Sickle cell trait ([place])   . Status post dilation of esophageal narrowing     Past Surgical History:  Procedure Laterality Date  . CESAREAN SECTION    . COLONOSCOPY      Family History  Adopted: Yes  Problem Relation Age of Onset  . Lung cancer Mother   . Cancer Mother   . Diabetes Mother   . Diabetes Sister   . Hyperlipidemia Sister   . Colon polyps Sister   . Diabetes Brother   . Hyperlipidemia Brother   . Diabetes Other        10 out of 13 siblings  . Hypertension Other        siblings  . Colon cancer Neg Hx   . Esophageal cancer Neg Hx   . Rectal cancer Neg Hx   . Stomach cancer Neg Hx     Social History   Socioeconomic History  . Marital status: Single    Spouse name: Not on file  .  Number of children: 3  . Years of education: Not on file  . Highest education level: Not on file  Occupational History    Employer: [employer]  Tobacco Use  . Smoking status: Never Smoker  . Smokeless tobacco: Never Used  Substance and Sexual Activity  . Alcohol use: Yes    Alcohol/week: 2.0 standard drinks    Types: 2 Standard drinks or equivalent per week    Comment: 1 per day  . Drug use: No  . Sexual activity: Not on file  Other Topics Concern  . Not on file  Social History Narrative  . Not on file   Social Determinants of Health   Financial Resource Strain:   . Difficulty of Paying Living Expenses:   Food Insecurity:   . Worried About Charity fundraiser in the Last Year:   . Arboriculturist in the Last Year:   Transportation Needs:   . Film/video editor (Medical):   Marland Kitchen Lack of Transportation (Non-Medical):   Physical Activity:   . Days of Exercise per Week:   . Minutes of Exercise per Session:   Stress:   . Feeling of Stress :   Social Connections:   . Frequency of Communication with Friends and Family:   .  Frequency of Social Gatherings with Friends and Family:   . Attends Religious Services:   . Active Member of Clubs or Organizations:   . Attends Archivist Meetings:   Marland Kitchen Marital Status:   Intimate Partner Violence:   . Fear of Current or Ex-Partner:   . Emotionally Abused:   Marland Kitchen Physically Abused:   . Sexually Abused:     Review of systems: Review of Systems  Constitutional: Negative for fever and chills.  HENT: Negative.   Eyes: Negative for blurred vision.  Respiratory: as per HPI  Cardiovascular: Negative for chest pain and palpitations.  Gastrointestinal: Negative for vomiting, diarrhea, blood per rectum. Genitourinary: Negative for dysuria, urgency, frequency and hematuria.  Musculoskeletal: Negative for myalgias, back pain and joint pain.  Skin: Negative for itching and rash.  Neurological: Negative for dizziness, tremors,  focal weakness, seizures and loss of consciousness.  Endo/Heme/Allergies: Negative for environmental allergies.  Psychiatric/Behavioral: Negative for depression, suicidal ideas and hallucinations.  All other systems reviewed and are negative.  Physical Exam: Blood pressure 138/64, pulse 80, temperature (!) 97.3 F (36.3 C), temperature source Temporal, height 5' 5.5" (1.664 m), weight 153 lb (69.4 kg), SpO2 99 %. Gen:      No acute distress HEENT:  EOMI, sclera anicteric Neck:     No masses; no thyromegaly Lungs:    Clear to auscultation bilaterally; normal respiratory effort*** CV:         Regular rate and rhythm; no murmurs Abd:      + bowel sounds; soft, non-tender; no palpable masses, no distension Ext:    No edema; adequate peripheral perfusion Skin:      Warm and dry; no rash Neuro: alert and oriented x 3 Psych: normal mood and affect  Data Reviewed: Imaging:  PFTs:  Labs:  Assessment:  ***  Plan/Recommendations: *** This appointment required *** minutes of patient care (this includes precharting, chart review, review of results, face-to-face care, etc.).  Marshell Garfinkel MD [facility] 12/08/2019, 10:45 AM  CC: Newton Pigg, MD

## 2019-12-13 ENCOUNTER — Telehealth: Payer: Self-pay | Admitting: Pulmonary Disease

## 2019-12-13 NOTE — Telephone Encounter (Signed)
Attempted to call pt but unable to reach and unable to leave a VM. Will try to call back later. 

## 2019-12-14 NOTE — Telephone Encounter (Signed)
Patient is returning phone call. Patient phone number is [phone]. Available until 3:00 pm.

## 2019-12-14 NOTE — Telephone Encounter (Signed)
ATC pt, there was no answer and I could not leave a message. Will try back. 

## 2019-12-14 NOTE — Telephone Encounter (Signed)
Spoke to pt and advised that Dr Vaughan Browner has ordered a High Resolution Chest CT. I explained the difference in this kind of CT versus a CT Chest W/O Contrast. Pt verbalized understanding. Nothing further needed.

## 2019-12-14 NOTE — Telephone Encounter (Signed)
He ordered a HRCT and these are done without LMTCB again

## 2019-12-20 ENCOUNTER — Ambulatory Visit (INDEPENDENT_AMBULATORY_CARE_PROVIDER_SITE_OTHER)
Admission: RE | Admit: 2019-12-20 | Discharge: 2019-12-20 | Disposition: A | Discharge status: Home / Self Care | Payer: BC Managed Care – PPO | Source: Ambulatory Visit | Attending: Pulmonary Disease | Admitting: Pulmonary Disease

## 2019-12-20 ENCOUNTER — Other Ambulatory Visit: Payer: Self-pay

## 2019-12-20 DIAGNOSIS — R05 Cough: Secondary | ICD-10-CM | POA: Diagnosis not present

## 2019-12-20 DIAGNOSIS — R059 Cough, unspecified: Secondary | ICD-10-CM

## 2020-01-23 ENCOUNTER — Other Ambulatory Visit (HOSPITAL_COMMUNITY): Payer: BC Managed Care – PPO

## 2020-03-19 ENCOUNTER — Other Ambulatory Visit: Payer: Self-pay | Admitting: *Deleted

## 2020-03-19 ENCOUNTER — Other Ambulatory Visit: Payer: BC Managed Care – PPO

## 2020-03-19 ENCOUNTER — Other Ambulatory Visit: Payer: Self-pay

## 2020-03-19 DIAGNOSIS — Z20822 Contact with and (suspected) exposure to covid-19: Secondary | ICD-10-CM

## 2020-03-20 LAB — SARS-COV-2, NAA 2 DAY TAT

## 2020-03-20 LAB — NOVEL CORONAVIRUS, NAA: SARS-CoV-2, NAA: NOT DETECTED

## 2020-03-22 DIAGNOSIS — Z23 Encounter for immunization: Secondary | ICD-10-CM | POA: Diagnosis not present

## 2020-03-27 ENCOUNTER — Other Ambulatory Visit: Payer: Self-pay | Admitting: Pulmonary Disease

## 2020-05-24 ENCOUNTER — Other Ambulatory Visit: Payer: Self-pay | Admitting: Pulmonary Disease

## 2020-08-09 DIAGNOSIS — R102 Pelvic and perineal pain: Secondary | ICD-10-CM | POA: Diagnosis not present

## 2020-08-09 DIAGNOSIS — S39012A Strain of muscle, fascia and tendon of lower back, initial encounter: Secondary | ICD-10-CM | POA: Diagnosis not present

## 2020-08-22 DIAGNOSIS — R102 Pelvic and perineal pain: Secondary | ICD-10-CM | POA: Diagnosis not present

## 2020-08-31 ENCOUNTER — Other Ambulatory Visit: Payer: Self-pay | Admitting: Pulmonary Disease

## 2020-09-04 DIAGNOSIS — J343 Hypertrophy of nasal turbinates: Secondary | ICD-10-CM | POA: Diagnosis not present

## 2020-09-04 DIAGNOSIS — J320 Chronic maxillary sinusitis: Secondary | ICD-10-CM | POA: Diagnosis not present

## 2020-09-04 DIAGNOSIS — J310 Chronic rhinitis: Secondary | ICD-10-CM | POA: Diagnosis not present

## 2020-09-11 ENCOUNTER — Ambulatory Visit (INDEPENDENT_AMBULATORY_CARE_PROVIDER_SITE_OTHER): Payer: BC Managed Care – PPO | Admitting: Otolaryngology

## 2020-09-17 ENCOUNTER — Other Ambulatory Visit: Payer: Self-pay | Admitting: Otolaryngology

## 2020-09-17 DIAGNOSIS — J329 Chronic sinusitis, unspecified: Secondary | ICD-10-CM

## 2020-09-28 ENCOUNTER — Ambulatory Visit
Admission: RE | Admit: 2020-09-28 | Discharge: 2020-09-28 | Disposition: A | Discharge status: Home / Self Care | Payer: BC Managed Care – PPO | Source: Ambulatory Visit | Attending: Otolaryngology | Admitting: Otolaryngology

## 2020-09-28 ENCOUNTER — Other Ambulatory Visit: Payer: Self-pay

## 2020-09-28 DIAGNOSIS — J329 Chronic sinusitis, unspecified: Secondary | ICD-10-CM

## 2020-09-28 DIAGNOSIS — J321 Chronic frontal sinusitis: Secondary | ICD-10-CM | POA: Diagnosis not present

## 2020-09-28 DIAGNOSIS — J0110 Acute frontal sinusitis, unspecified: Secondary | ICD-10-CM | POA: Diagnosis not present

## 2020-10-21 DIAGNOSIS — R109 Unspecified abdominal pain: Secondary | ICD-10-CM | POA: Diagnosis not present

## 2020-10-21 DIAGNOSIS — N882 Stricture and stenosis of cervix uteri: Secondary | ICD-10-CM | POA: Diagnosis not present

## 2020-11-25 DIAGNOSIS — R7989 Other specified abnormal findings of blood chemistry: Secondary | ICD-10-CM | POA: Diagnosis not present

## 2020-11-27 ENCOUNTER — Telehealth: Payer: Self-pay | Admitting: *Deleted

## 2020-11-27 NOTE — Telephone Encounter (Signed)
Dr. Ulanda Edison and Dr. Benay Spice discussed case and Dr. Ulanda Edison will be sending a referral to office. Dr. Benay Spice has agreed to see patient. Awaiting records.

## 2020-11-28 ENCOUNTER — Other Ambulatory Visit: Payer: Self-pay | Admitting: Nurse Practitioner

## 2020-11-28 DIAGNOSIS — D709 Neutropenia, unspecified: Secondary | ICD-10-CM

## 2020-12-05 ENCOUNTER — Telehealth: Payer: Self-pay | Admitting: Hematology and Oncology

## 2020-12-05 NOTE — Telephone Encounter (Signed)
Received a new hem referral from Dr. Ulanda Edison for neutropenia. Teresa Orr returned my call and has been scheduled to see Dr. Lorenso Courier on 5/16 at 9am. Pt aware to arrive 20 minutes early.

## 2020-12-09 ENCOUNTER — Encounter: Payer: Self-pay | Admitting: Hematology and Oncology

## 2020-12-09 ENCOUNTER — Inpatient Hospital Stay: Payer: BC Managed Care – PPO | Attending: Hematology and Oncology | Admitting: Hematology and Oncology

## 2020-12-09 ENCOUNTER — Other Ambulatory Visit: Payer: Self-pay

## 2020-12-09 ENCOUNTER — Inpatient Hospital Stay: Payer: BC Managed Care – PPO

## 2020-12-09 VITALS — BP 153/77 | HR 68 | Temp 97.8°F | Resp 17 | Ht 65.5 in | Wt 155.4 lb

## 2020-12-09 DIAGNOSIS — Z8249 Family history of ischemic heart disease and other diseases of the circulatory system: Secondary | ICD-10-CM | POA: Insufficient documentation

## 2020-12-09 DIAGNOSIS — D709 Neutropenia, unspecified: Secondary | ICD-10-CM | POA: Insufficient documentation

## 2020-12-09 DIAGNOSIS — Z79899 Other long term (current) drug therapy: Secondary | ICD-10-CM | POA: Insufficient documentation

## 2020-12-09 DIAGNOSIS — Z833 Family history of diabetes mellitus: Secondary | ICD-10-CM | POA: Insufficient documentation

## 2020-12-09 DIAGNOSIS — K219 Gastro-esophageal reflux disease without esophagitis: Secondary | ICD-10-CM | POA: Diagnosis not present

## 2020-12-09 DIAGNOSIS — Z801 Family history of malignant neoplasm of trachea, bronchus and lung: Secondary | ICD-10-CM | POA: Insufficient documentation

## 2020-12-09 DIAGNOSIS — Z8349 Family history of other endocrine, nutritional and metabolic diseases: Secondary | ICD-10-CM | POA: Diagnosis not present

## 2020-12-09 DIAGNOSIS — Z8371 Family history of colonic polyps: Secondary | ICD-10-CM | POA: Insufficient documentation

## 2020-12-09 DIAGNOSIS — D573 Sickle-cell trait: Secondary | ICD-10-CM | POA: Insufficient documentation

## 2020-12-09 DIAGNOSIS — Z809 Family history of malignant neoplasm, unspecified: Secondary | ICD-10-CM | POA: Diagnosis not present

## 2020-12-09 LAB — SAVE SMEAR(SSMR), FOR PROVIDER SLIDE REVIEW

## 2020-12-09 LAB — CBC WITH DIFFERENTIAL (CANCER CENTER ONLY)
Abs Immature Granulocytes: 0.01 10*3/uL (ref 0.00–0.07)
Basophils Absolute: 0 10*3/uL (ref 0.0–0.1)
Basophils Relative: 1 %
Eosinophils Absolute: 0.1 10*3/uL (ref 0.0–0.5)
Eosinophils Relative: 1 %
HCT: 36.5 % (ref 36.0–46.0)
Hemoglobin: 11.6 g/dL — ABNORMAL LOW (ref 12.0–15.0)
Immature Granulocytes: 0 %
Lymphocytes Relative: 46 %
Lymphs Abs: 2.1 10*3/uL (ref 0.7–4.0)
MCH: 28.6 pg (ref 26.0–34.0)
MCHC: 31.8 g/dL (ref 30.0–36.0)
MCV: 89.9 fL (ref 80.0–100.0)
Monocytes Absolute: 0.4 10*3/uL (ref 0.1–1.0)
Monocytes Relative: 8 %
Neutro Abs: 2 10*3/uL (ref 1.7–7.7)
Neutrophils Relative %: 44 %
Platelet Count: 212 10*3/uL (ref 150–400)
RBC: 4.06 MIL/uL (ref 3.87–5.11)
RDW: 13.3 % (ref 11.5–15.5)
WBC Count: 4.6 10*3/uL (ref 4.0–10.5)
nRBC: 0 % (ref 0.0–0.2)

## 2020-12-09 LAB — CMP (CANCER CENTER ONLY)
ALT: 16 U/L (ref 0–44)
AST: 23 U/L (ref 15–41)
Albumin: 4.2 g/dL (ref 3.5–5.0)
Alkaline Phosphatase: 86 U/L (ref 38–126)
Anion gap: 6 (ref 5–15)
BUN: 14 mg/dL (ref 8–23)
CO2: 30 mmol/L (ref 22–32)
Calcium: 9.4 mg/dL (ref 8.9–10.3)
Chloride: 106 mmol/L (ref 98–111)
Creatinine: 0.74 mg/dL (ref 0.44–1.00)
GFR, Estimated: >60 mL/min (ref >60)
Glucose, Bld: 97 mg/dL (ref 70–99)
Potassium: 4.1 mmol/L (ref 3.5–5.1)
Sodium: 142 mmol/L (ref 135–145)
Total Bilirubin: 0.3 mg/dL (ref 0.3–1.2)
Total Protein: 7.4 g/dL (ref 6.5–8.1)

## 2020-12-09 LAB — HIV ANTIBODY (ROUTINE TESTING W REFLEX): HIV Screen 4th Generation wRfx: NONREACTIVE

## 2020-12-09 LAB — TSH: TSH: 0.867 u[IU]/mL (ref 0.308–3.960)

## 2020-12-09 LAB — HEPATITIS B SURFACE ANTIGEN: Hepatitis B Surface Ag: NONREACTIVE

## 2020-12-09 LAB — FOLATE: Folate: 9.9 ng/mL (ref >5.9)

## 2020-12-09 LAB — HEPATITIS C ANTIBODY: HCV Ab: NONREACTIVE

## 2020-12-09 LAB — VITAMIN B12: Vitamin B-12: 236 pg/mL (ref 180–914)

## 2020-12-09 LAB — HEPATITIS B CORE ANTIBODY, TOTAL: Hep B Core Total Ab: NONREACTIVE

## 2020-12-09 LAB — SEDIMENTATION RATE: Sed Rate: 9 mm/hr (ref 0–22)

## 2020-12-09 NOTE — Progress Notes (Signed)
[place] [phone]   Fax:([phone]) [place] NOTE  Patient Care Team: Newton Pigg, MD as PCP - General (Obstetrics and Gynecology)  Hematological/Oncological History # Mild Neutropenia 07/09/2006: WBC 3.9, Hgb 11.6, MCV 87.9, Plt 233 01/22/2016: WBC 3.7, Hgb 121.1, MCV 86.8, Plt 197 05/08/2017: WBC 4.1, Hgb 11.1, MCV 87.2, Plt 195 10/21/2020: WBC 3.8, Hgb 11.7, MCV 86, Plt 211, Neutrophils 33%, ANC 1.3 11/25/2020: WBC 4.5, Hgb 11.5, MCV 87, Plt 207. Neutrophils 34%. [place] 1.5 12/09/2020: establish care with Dr. Lorenso Courier  CHIEF COMPLAINTS/PURPOSE OF CONSULTATION:  "Neutropenia "  HISTORY OF PRESENTING ILLNESS:  Teresa Orr 65 y.o. female with medical history significant for GERD, sickle cell trait, anxiety who presents for evaluation of a mild chronic neutropenia.   On review of the previous records Teresa Orr has had low normal neutrophils for mild neutropenia since at least December 2007.  At that time she was noted to have an [place] of 1.5.  She is consistently had an [place] anywhere from 1.0-2.5.  Her white blood cell count rarely drops below 3 and appears to have a baseline of approximately 4.  Most recently on 11/25/2020 the patient was adequate with about 4.5 with an [place] of 1.5.  Due to concern for this chronic neutropenia she is referred to hematology for further evaluation management.  On exam today Teresa Orr reports she does have issues with allergies and chronic sinus issues but no frank sinus infections.  She is not prone to urinary tract infections or pneumonias.  She reports the last time she was on antibiotics was the beginning of the year with a Z-Pak for sinus issues.  She reports that she "does not eat as well as she should".  And that she should eat more vegetables.  She does eat red meat and does not have any dietary restrictions.  To the best of her knowledge she has never been screened for hepatitis B, C, or HIV.  On further  questioning she is a never smoker and does not vape.  She drinks wine approximately 4 times per week.  She currently works in [employer].  Her family history is obscured because she is adopted.  She does report though having a history of sickle cell trait.  She has had prior issues before with itching for which she does still occasionally take Benadryl.  She currently denies any fevers, chills, sweats, nausea, or diarrhea.  Full 10 point ROS is listed below.  MEDICAL HISTORY:  Past Medical History:  Diagnosis Date  . Allergy   . Anxiety   . GERD (gastroesophageal reflux disease)   . Sickle cell trait ([place])   . Status post dilation of esophageal narrowing     SURGICAL HISTORY: Past Surgical History:  Procedure Laterality Date  . CESAREAN SECTION    . COLONOSCOPY      SOCIAL HISTORY: Social History   Socioeconomic History  . Marital status: Single    Spouse name: Not on file  . Number of children: 3  . Years of education: Not on file  . Highest education level: Not on file  Occupational History    Employer: [employer]  Tobacco Use  . Smoking status: Never Smoker  . Smokeless tobacco: Never Used  Vaping Use  . Vaping Use: Never used  Substance and Sexual Activity  . Alcohol use: Yes    Alcohol/week: 2.0 standard drinks    Types: 2 Standard drinks or equivalent per week  Comment: 1 per day  . Drug use: No  . Sexual activity: Not on file  Other Topics Concern  . Not on file  Social History Narrative  . Not on file   Social Determinants of Health   Financial Resource Strain: Not on file  Food Insecurity: Not on file  Transportation Needs: Not on file  Physical Activity: Not on file  Stress: Not on file  Social Connections: Not on file  Intimate Partner Violence: Not on file    FAMILY HISTORY: Family History  Adopted: Yes  Problem Relation Age of Onset  . Lung cancer Mother   . Cancer Mother   . Diabetes Mother   . Diabetes Sister   .  Hyperlipidemia Sister   . Colon polyps Sister   . Diabetes Brother   . Hyperlipidemia Brother   . Diabetes Other        10 out of 13 siblings  . Hypertension Other        siblings  . Colon cancer Neg Hx   . Esophageal cancer Neg Hx   . Rectal cancer Neg Hx   . Stomach cancer Neg Hx     ALLERGIES:  is allergic to nexium [esomeprazole magnesium] and tussionex pennkinetic er [hydrocod polst-cpm polst er].  MEDICATIONS:  Current Outpatient Medications  Medication Sig Dispense Refill  . cetirizine (ZYRTEC) 10 MG tablet Take 1 tablet (10 mg total) by mouth daily. 30 tablet 0  . Cimetidine (TAGAMET PO) Take by mouth.    . dextromethorphan-guaiFENesin (MUCINEX DM) 30-600 MG 12hr tablet Take 1 tablet by mouth once.    . fluticasone (FLONASE) 50 MCG/ACT nasal spray Place 2 sprays into both nostrils daily.     No current facility-administered medications for this visit.    REVIEW OF SYSTEMS:   Constitutional: ( - ) fevers, ( - )  chills , ( - ) night sweats Eyes: ( - ) blurriness of vision, ( - ) double vision, ( - ) watery eyes Ears, nose, mouth, throat, and face: ( - ) mucositis, ( - ) sore throat Respiratory: ( - ) cough, ( - ) dyspnea, ( - ) wheezes Cardiovascular: ( - ) palpitation, ( - ) chest discomfort, ( - ) lower extremity swelling Gastrointestinal:  ( - ) nausea, ( - ) heartburn, ( - ) change in bowel habits Skin: ( - ) abnormal skin rashes Lymphatics: ( - ) new lymphadenopathy, ( - ) easy bruising Neurological: ( - ) numbness, ( - ) tingling, ( - ) new weaknesses Behavioral/Psych: ( - ) mood change, ( - ) new changes  All other systems were reviewed with the patient and are negative.  PHYSICAL EXAMINATION:  Vitals:   12/09/20 0906  BP: (!) 153/77  Pulse: 68  Resp: 17  Temp: 97.8 F (36.6 C)  SpO2: 99%   Filed Weights   12/09/20 0906  Weight: 155 lb 6.4 oz (70.5 kg)    GENERAL: well appearing middle aged [place] American female in NAD  SKIN: skin color,  texture, turgor are normal, no rashes or significant lesions EYES: conjunctiva are pink and non-injected, sclera clear LUNGS: clear to auscultation and percussion with normal breathing effort HEART: regular rate & rhythm and no murmurs and no lower extremity edema Musculoskeletal: no cyanosis of digits and no clubbing  PSYCH: alert & oriented x 3, fluent speech NEURO: no focal motor/sensory deficits  LABORATORY DATA:  I have reviewed the data as listed CBC Latest Ref Rng & Units 12/09/2020  05/08/2017 01/22/2016  WBC 4.0 - 10.5 K/uL 4.6 4.1 3.7(L)  Hemoglobin 12.0 - 15.0 g/dL 11.6(L) 11.1(L) 12.1  Hematocrit 36.0 - 46.0 % 36.5 34.2(L) 37.5  Platelets 150 - 400 K/uL 212 195 197    CMP Latest Ref Rng & Units 12/09/2020 05/08/2017 10/21/2016  Glucose 70 - 99 mg/dL 97 103(H) 90  BUN 8 - 23 mg/dL _0 Creatinine 0.44 - 1.00 mg/dL 0.74 0.89 0.69  Sodium 135 - 145 mmol/L 142 141 141  Potassium 3.5 - 5.1 mmol/L 4.1 3.5 4.2  Chloride 98 - 111 mmol/L 106 104 100  CO2 22 - 32 mmol/L _1 Calcium 8.9 - 10.3 mg/dL 9.4 9.2 9.4  Total Protein 6.5 - 8.1 g/dL 7.4 7.3 -  Total Bilirubin 0.3 - 1.2 mg/dL 0.3 0.2(L) -  Alkaline Phos 38 - 126 U/L 86 66 -  AST 15 - 41 U/L 23 20 -  ALT 0 - 44 U/L 16 15 -   RADIOGRAPHIC STUDIES: No results found.  ASSESSMENT & PLAN Teresa Orr 65 y.o. female with medical history significant for GERD, sickle cell trait, anxiety who presents for evaluation of a mild chronic neutropenia.  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  In order to effectively rule out other causes today we will evaluate her for nutritional etiologies, viral etiologies, and thyroid disease/inflammatory causes.  She has had this low ANC for approximately 15 years and has been relatively stable.  She is also not prior to recurrent infections.  Given these findings this  favors a benign process but in the interest of being thorough we will work-up other possible etiologies.  In the event there is no clear etiology found no routine follow-up is required.  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  ANC has been stable for 15 years -- Will perform additional work-up with vitamin B12 and folate -- Viral work-up with hep B, hep C, and HIV -- We will order TSH, ESR, CRP for inflammatory and thyroid work-up -- If no clear etiology can be found assume this is a benign process.  Return to clinic as needed.   Orders Placed This Encounter  Procedures  . CBC with Differential ([facility]cer Center Only)    Standing Status:   Future    Number of Occurrences:   1    Standing Expiration Date:   12/09/2021  . CMP ([facility] only)    Standing Status:   Future    Number of Occurrences:   1    Standing Expiration Date:   12/09/2021  . Folate, Serum    Standing Status:   Future    Number of Occurrences:   1    Standing Expiration Date:   12/09/2021  . Vitamin B12    Standing Status:   Future    Number of Occurrences:   1    Standing Expiration Date:   12/09/2021  . Hepatitis C antibody    Standing Status:   Future    Number of Occurrences:   1    Standing Expiration Date:   12/09/2021  . HIV antibody (with reflex)    Standing Status:   Future    Number of Occurrences:   1    Standing Expiration Date:   12/09/2021  . Hepatitis B surface antigen    Standing Status:  Future    Number of Occurrences:   1    Standing Expiration Date:   12/09/2021  . Hepatitis B core antibody, total    Standing Status:   Future    Number of Occurrences:   1    Standing Expiration Date:   12/09/2021  . TSH    Standing Status:   Future    Number of Occurrences:   1    Standing Expiration Date:   12/09/2021  . Sedimentation rate    Standing Status:   Future    Number of Occurrences:   1    Standing Expiration Date:   12/09/2021   All questions  were answered. The patient knows to call the clinic with any problems, questions or concerns.  A total of more than 60 minutes were spent on this encounter with face-to-face time and non-face-to-face time, including preparing to see the patient, ordering tests and/or medications, counseling the patient and coordination of care as outlined above.   Ledell Peoples, MD Department of Hematology/Oncology [place] [facility] Phone: [phone] Pager: [phone] Email: [email or web address]  12/10/2020 9:58 AM

## 2020-12-13 ENCOUNTER — Telehealth: Payer: Self-pay | Admitting: *Deleted

## 2020-12-13 NOTE — Telephone Encounter (Signed)
-----   Message from Orson Slick, MD sent at 12/12/2020  2:16 PM EDT ----- Please let [name] know that her labs did not show a clear cause for her low WBC. Her WBC has been stable x 15 years. There is no need for routine f/u in our clinic.   ----- Message ----- From: Jones: 12/09/2020  10:24 AM EDT To: Orson Slick, MD

## 2020-12-13 NOTE — Telephone Encounter (Signed)
TCT patient regarding her recent lab results. Spoke with patient and advised that Dr. Lorenso Courier did not find a clear cause for her low WBC's.  He states they have been stable for the last 15 years. Advised there is no need to follow up in this  Clinic. Pt voiced understanding.

## 2020-12-15 DIAGNOSIS — Z20822 Contact with and (suspected) exposure to covid-19: Secondary | ICD-10-CM | POA: Diagnosis not present

## 2020-12-28 ENCOUNTER — Other Ambulatory Visit: Payer: Self-pay | Admitting: Pulmonary Disease

## 2021-07-15 DIAGNOSIS — R059 Cough, unspecified: Secondary | ICD-10-CM | POA: Diagnosis not present

## 2021-07-15 DIAGNOSIS — J3089 Other allergic rhinitis: Secondary | ICD-10-CM | POA: Diagnosis not present

## 2021-10-17 DIAGNOSIS — Z130 Encounter for screening for diseases of the blood and blood-forming organs and certain disorders involving the immune mechanism: Secondary | ICD-10-CM | POA: Diagnosis not present

## 2021-10-17 DIAGNOSIS — N644 Mastodynia: Secondary | ICD-10-CM | POA: Diagnosis not present

## 2021-10-17 DIAGNOSIS — E669 Obesity, unspecified: Secondary | ICD-10-CM | POA: Diagnosis not present

## 2021-10-17 DIAGNOSIS — Z01419 Encounter for gynecological examination (general) (routine) without abnormal findings: Secondary | ICD-10-CM | POA: Diagnosis not present

## 2021-10-17 DIAGNOSIS — Z1389 Encounter for screening for other disorder: Secondary | ICD-10-CM | POA: Diagnosis not present

## 2021-10-21 ENCOUNTER — Other Ambulatory Visit: Payer: Self-pay | Admitting: Obstetrics and Gynecology

## 2021-10-21 DIAGNOSIS — N644 Mastodynia: Secondary | ICD-10-CM

## 2021-10-31 DIAGNOSIS — N644 Mastodynia: Secondary | ICD-10-CM | POA: Diagnosis not present

## 2021-10-31 DIAGNOSIS — R922 Inconclusive mammogram: Secondary | ICD-10-CM | POA: Diagnosis not present

## 2022-01-14 DIAGNOSIS — Z13220 Encounter for screening for lipoid disorders: Secondary | ICD-10-CM | POA: Diagnosis not present

## 2022-01-14 DIAGNOSIS — R030 Elevated blood-pressure reading, without diagnosis of hypertension: Secondary | ICD-10-CM | POA: Diagnosis not present

## 2022-01-14 DIAGNOSIS — K219 Gastro-esophageal reflux disease without esophagitis: Secondary | ICD-10-CM | POA: Diagnosis not present

## 2022-01-14 DIAGNOSIS — Z0000 Encounter for general adult medical examination without abnormal findings: Secondary | ICD-10-CM | POA: Diagnosis not present

## 2022-01-14 DIAGNOSIS — M25551 Pain in right hip: Secondary | ICD-10-CM | POA: Diagnosis not present

## 2022-01-19 ENCOUNTER — Other Ambulatory Visit: Payer: Self-pay | Admitting: Physician Assistant

## 2022-01-19 DIAGNOSIS — M810 Age-related osteoporosis without current pathological fracture: Secondary | ICD-10-CM

## 2022-03-21 ENCOUNTER — Ambulatory Visit (HOSPITAL_COMMUNITY)
Admission: EM | Admit: 2022-03-21 | Discharge: 2022-03-21 | Disposition: A | Discharge status: Home / Self Care | Payer: BC Managed Care – PPO

## 2022-03-21 ENCOUNTER — Encounter (HOSPITAL_COMMUNITY): Payer: Self-pay

## 2022-03-21 DIAGNOSIS — B370 Candidal stomatitis: Secondary | ICD-10-CM

## 2022-03-21 DIAGNOSIS — J028 Acute pharyngitis due to other specified organisms: Secondary | ICD-10-CM

## 2022-03-21 LAB — POCT RAPID STREP A, ED / UC: Streptococcus, Group A Screen (Direct): NEGATIVE

## 2022-03-21 MED ORDER — MAGIC MOUTHWASH W/LIDOCAINE: 5.0000 mL | Freq: Three times a day (TID) | ORAL | 0 refills | Status: AC | PRN

## 2022-03-21 MED ORDER — FLUCONAZOLE 100 MG PO TABS: ORAL_TABLET | ORAL | 0 refills | Status: DC

## 2022-03-21 NOTE — ED Triage Notes (Signed)
Onset this past Wednesday with coughing, sneezing, neck swelling, spots in the throat, sore throat.   No known sick exposure, no one around the Patient with similar symptoms.

## 2022-03-21 NOTE — Discharge Instructions (Signed)
Thank you for coming in today.  Your strep test was negative.  It looks like you have a oral yeast infection.  Please take the medications as directed.  Recheck if worse or no improvement.

## 2022-03-21 NOTE — ED Provider Notes (Signed)
[place]   MRN: [id number] DOB: 08-25-1955  Subjective:   Teresa Orr is a 66 y.o. female presenting for the following symptoms:  Chief complaint: Sore throat Symptom onset: 3 days ago on 03/18/22 Pertinent positives: Cough, sneezing, lymph node enlargement, ST Pertinent negatives: Fever, chills, SOB, CP, n/v/d, abd pain Treatments tried: Tylenol Vaccine status: All COVID shots completed Sick exposure: No known sick contacts  Patient also reports that she just completed a Z-Pak for her allergies last week.   No current facility-administered medications for this encounter.  Current Outpatient Medications:    dextromethorphan-guaiFENesin (MUCINEX DM) 30-600 MG 12hr tablet, Take 1 tablet by mouth once., Disp: , Rfl:    fluconazole (DIFLUCAN) 100 MG tablet, Take 2 tablets po together on day one, followed by one tab po qd for 6 days, Disp: 8 tablet, Rfl: 0   fluticasone (FLONASE) 50 MCG/ACT nasal spray, Place 2 sprays into both nostrils daily., Disp: , Rfl:    magic mouthwash w/lidocaine SOLN, Take 5 mLs by mouth 3 (three) times daily as needed for up to 10 days for mouth pain., Disp: 120 mL, Rfl: 0   omeprazole (PRILOSEC) 20 MG capsule, Take 20 mg by mouth every morning., Disp: , Rfl:    cetirizine (ZYRTEC) 10 MG tablet, Take 1 tablet (10 mg total) by mouth daily., Disp: 30 tablet, Rfl: 0   Cimetidine (TAGAMET PO), Take by mouth., Disp: , Rfl:    Allergies  Allergen Reactions   Nexium [Esomeprazole Magnesium] Itching   Tussionex Pennkinetic Er [Hydrocod Poli-Chlorphe Poli Er] Itching    Past Medical History:  Diagnosis Date   Allergy    Anxiety    GERD (gastroesophageal reflux disease)    Sickle cell trait ([facility])    Status post dilation of esophageal narrowing      Past Surgical History:  Procedure Laterality Date   CESAREAN SECTION     COLONOSCOPY      Family History  Adopted: Yes  Problem Relation Age of Onset   Lung cancer Mother     Cancer Mother    Diabetes Mother    Diabetes Sister    Hyperlipidemia Sister    Colon polyps Sister    Diabetes Brother    Hyperlipidemia Brother    Diabetes Other        52 out of 72 siblings   Hypertension Other        siblings   Colon cancer Neg Hx    Esophageal cancer Neg Hx    Rectal cancer Neg Hx    Stomach cancer Neg Hx     Social History   Tobacco Use   Smoking status: Never   Smokeless tobacco: Never  Vaping Use   Vaping Use: Never used  Substance Use Topics   Alcohol use: Yes    Alcohol/week: 2.0 standard drinks of alcohol    Types: 2 Standard drinks or equivalent per week    Comment: 1 per day   Drug use: No    ROS REFER TO HPI FOR PERTINENT POSITIVES AND NEGATIVES   Objective:   Vitals: BP 133/84 (BP Location: Left Arm)   Pulse 71   Temp 98.2 F (36.8 C) (Oral)   Resp 16   Ht 5' 5.5" (1.664 m)   Wt 165 lb (74.8 kg)   SpO2 98%   BMI 27.04 kg/m   Physical Exam Vitals and nursing note reviewed.  Constitutional:      General: She is not in acute  distress.    Appearance: Normal appearance. She is not ill-appearing.  HENT:     Head: Normocephalic.     Right Ear: Tympanic membrane, ear canal and external ear normal.     Left Ear: Tympanic membrane, ear canal and external ear normal.     Nose: Congestion (minimal) present.     Mouth/Throat:     Mouth: Mucous membranes are moist.     Pharynx: No oropharyngeal exudate or posterior oropharyngeal erythema (minimal erythema).     Comments: White patches diffusely on posterior oropharynx Eyes:     Extraocular Movements: Extraocular movements intact.     Conjunctiva/sclera: Conjunctivae normal.     Pupils: Pupils are equal, round, and reactive to light.  Cardiovascular:     Rate and Rhythm: Normal rate and regular rhythm.     Pulses: Normal pulses.     Heart sounds: Normal heart sounds. No murmur heard. Pulmonary:     Effort: Pulmonary effort is normal. No respiratory distress.     Breath sounds:  Normal breath sounds. No wheezing.  Musculoskeletal:     Cervical back: Normal range of motion.  Skin:    General: Skin is warm.  Neurological:     Mental Status: She is alert and oriented to person, place, and time.  Psychiatric:        Mood and Affect: Mood normal.        Behavior: Behavior normal.     Results for orders placed or performed during the hospital encounter of 03/21/22 (from the past 24 hour(s))  POCT Rapid Strep A     Status: None   Collection Time: 03/21/22 10:33 AM  Result Value Ref Range   Streptococcus, Group A Screen (Direct) NEGATIVE NEGATIVE    Assessment and Plan :   PDMP not reviewed this encounter.  1. Oral candidiasis   2. Acute pharyngitis due to other specified organisms    Reassured patient that her rapid strep a test was negative today.  She just completed a Z-Pak.  Her symptoms are consistent with oral candidiasis.  She is going to take fluconazole 200 mg today followed by 100 mg daily for 6 days.  I also provided Magic mouthwash to help with discomfort.  She knows to recheck middle of next week if worse or no improvement of symptoms.     AllwardtRanda Evens, PA-C 03/21/22 1050

## 2022-03-27 DIAGNOSIS — M810 Age-related osteoporosis without current pathological fracture: Secondary | ICD-10-CM | POA: Diagnosis not present

## 2022-03-27 DIAGNOSIS — Z780 Asymptomatic menopausal state: Secondary | ICD-10-CM | POA: Diagnosis not present

## 2022-03-27 DIAGNOSIS — M85851 Other specified disorders of bone density and structure, right thigh: Secondary | ICD-10-CM | POA: Diagnosis not present

## 2022-04-07 DIAGNOSIS — R030 Elevated blood-pressure reading, without diagnosis of hypertension: Secondary | ICD-10-CM | POA: Diagnosis not present

## 2022-04-07 DIAGNOSIS — M810 Age-related osteoporosis without current pathological fracture: Secondary | ICD-10-CM | POA: Diagnosis not present

## 2022-04-29 DIAGNOSIS — R102 Pelvic and perineal pain: Secondary | ICD-10-CM | POA: Diagnosis not present

## 2022-05-15 ENCOUNTER — Ambulatory Visit
Admission: RE | Admit: 2022-05-15 | Discharge: 2022-05-15 | Disposition: A | Discharge status: Home / Self Care | Payer: BC Managed Care – PPO | Source: Ambulatory Visit | Attending: Physician Assistant | Admitting: Physician Assistant

## 2022-05-15 ENCOUNTER — Other Ambulatory Visit: Payer: Self-pay | Admitting: Physician Assistant

## 2022-05-15 DIAGNOSIS — M25551 Pain in right hip: Secondary | ICD-10-CM

## 2022-05-15 DIAGNOSIS — M25552 Pain in left hip: Secondary | ICD-10-CM | POA: Diagnosis not present

## 2022-07-21 ENCOUNTER — Ambulatory Visit
Admission: EM | Admit: 2022-07-21 | Discharge: 2022-07-21 | Disposition: A | Discharge status: Home / Self Care | Payer: BC Managed Care – PPO | Attending: Family Medicine | Admitting: Family Medicine

## 2022-07-21 DIAGNOSIS — J111 Influenza due to unidentified influenza virus with other respiratory manifestations: Secondary | ICD-10-CM

## 2022-07-21 DIAGNOSIS — J069 Acute upper respiratory infection, unspecified: Secondary | ICD-10-CM

## 2022-07-21 MED ORDER — HYDROCOD POLI-CHLORPHE POLI ER 10-8 MG/5ML PO SUER: ORAL | 0 refills | Status: DC

## 2022-07-21 NOTE — ED Triage Notes (Signed)
Pt [facility] to [facility] with [facility] of cough congestion runny nose for 4 days pt reports 2 at home neg Covid test. Pt has been taking robitussin dm,sinus medication,

## 2022-07-21 NOTE — ED Provider Notes (Signed)
[facility]   [id number] 07/21/22 Arrival Time: 4268  ASSESSMENT & PLAN:  1. Viral URI with cough   2. Influenza-like illness    Discussed typical duration of likely viral illness. Suspect influenza. Testing unavailable. OTC symptom care as needed.  Discharge Medication List as of 07/21/2022  7:43 PM     START taking these medications   Details  chlorpheniramine-HYDROcodone (TUSSIONEX) 10-8 MG/5ML Take 2.5 - 5 mL every 12 hours as needed for cough., Normal       Work note provided.   Follow-up Information     Newton Pigg, MD.   Specialty: Obstetrics and Gynecology Why: If worsening or failing to improve as anticipated. Contact information: [place] [id number] [phone]                 Reviewed expectations re: course of current medical issues. Questions answered. Outlined signs and symptoms indicating need for more acute intervention. Understanding verbalized. After Visit Summary given.   SUBJECTIVE: History from: Patient. Teresa Orr is a 66 y.o. female. Pt [facility] with [facility] of cough congestion runny nose for 4 days pt reports 2 at home neg Covid test. Pt has been taking robitussin dm,sinus medication,  Ques subj fever.  Normal PO intake without n/v/d.  OBJECTIVE:  Vitals:   07/21/22 1927  BP: (!) 135/50  Pulse: 71  Resp: 19  Temp: 99.3 F (37.4 C)  TempSrc: Oral  SpO2: 98%    General appearance: alert; no distress; fatigued-appearing Eyes: PERRLA; EOMI; conjunctiva normal HENT: [place]; AT; with nasal congestion Neck: supple  Lungs: speaks full sentences without difficulty; unlabored; deep active cough Extremities: no edema Skin: warm and dry Neurologic: normal gait Psychological: alert and cooperative; normal mood and affect    Allergies  Allergen Reactions   Nexium [Esomeprazole Magnesium] Itching   Tussionex Pennkinetic Er [Hydrocod Poli-Chlorphe Poli Er] Itching    Past  Medical History:  Diagnosis Date   Allergy    Anxiety    GERD (gastroesophageal reflux disease)    Sickle cell trait (HCC)    Status post dilation of esophageal narrowing    Social History   Socioeconomic History   Marital status: Single    Spouse name: Not on file   Number of children: 3   Years of education: Not on file   Highest education level: Not on file  Occupational History    Employer: [employer]  Tobacco Use   Smoking status: Never   Smokeless tobacco: Never  Vaping Use   Vaping Use: Never used  Substance and Sexual Activity   Alcohol use: Yes    Alcohol/week: 2.0 standard drinks of alcohol    Types: 2 Standard drinks or equivalent per week    Comment: 1 per day   Drug use: No   Sexual activity: Not on file  Other Topics Concern   Not on file  Social History Narrative   Not on file   Social Determinants of Health   Financial Resource Strain: Not on file  Food Insecurity: Not on file  Transportation Needs: Not on file  Physical Activity: Not on file  Stress: Not on file  Social Connections: Not on file  Intimate Partner Violence: Not on file   Family History  Adopted: Yes  Problem Relation Age of Onset   Lung cancer Mother    Cancer Mother    Diabetes Mother    Diabetes Sister    Hyperlipidemia Sister  Colon polyps Sister    Diabetes Brother    Hyperlipidemia Brother    Diabetes Other        29 out of 13 siblings   Hypertension Other        siblings   Colon cancer Neg Hx    Esophageal cancer Neg Hx    Rectal cancer Neg Hx    Stomach cancer Neg Hx    Past Surgical History:  Procedure Laterality Date   CESAREAN SECTION     COLONOSCOPY       Vanessa Kick, MD 07/21/22 2002

## 2022-07-21 NOTE — Discharge Instructions (Signed)
Be aware, your cough medication may cause drowsiness. Please do not drive, operate heavy machinery or make important decisions while on this medication as it may cloud your judgement.

## 2022-08-17 IMAGING — CT CT MAXILLOFACIAL W/O CM
1 series · 16 of 30 positions shown, 20 images · non-contrast
Comparison: Brain MRI 01/22/2016

CLINICAL DATA: Chronic sinusitis

EXAM:
CT MAXILLOFACIAL WITHOUT CONTRAST
TECHNIQUE: Multidetector CT images of the paranasal sinuses were obtained using
the standard protocol without intravenous contrast.

[Series 4: soft tissue · axial · 0.39mm/px · z∈[-158,-58]mm · 16 of 108 slices shown, 20 images]
[im 4/108  brain]
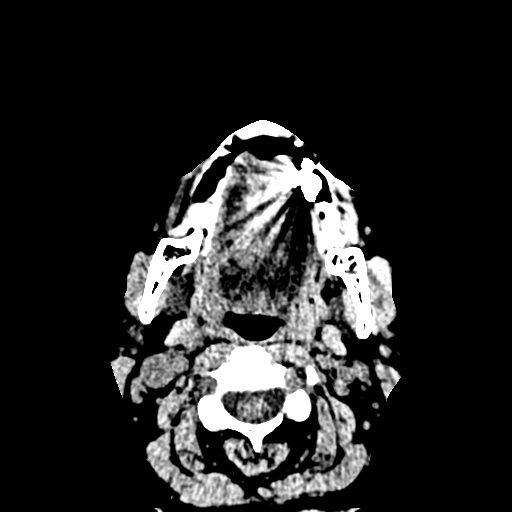
[im 4/108  bone]
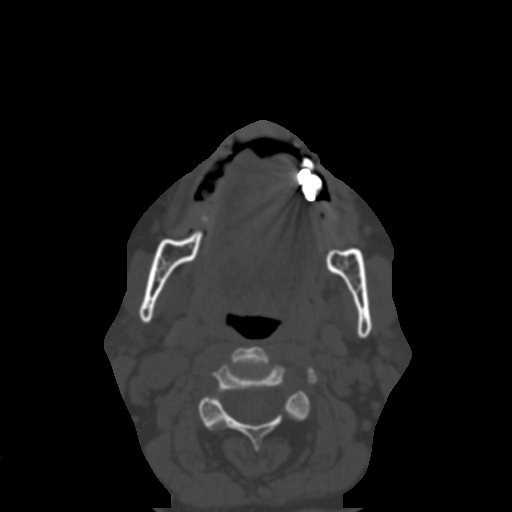
[im 12/108  bone]
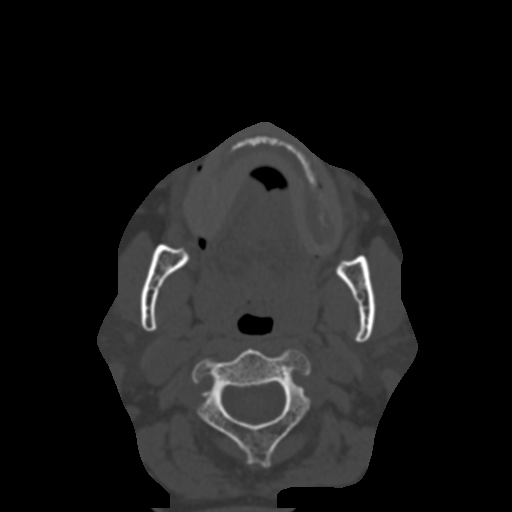
[im 19/108  bone]
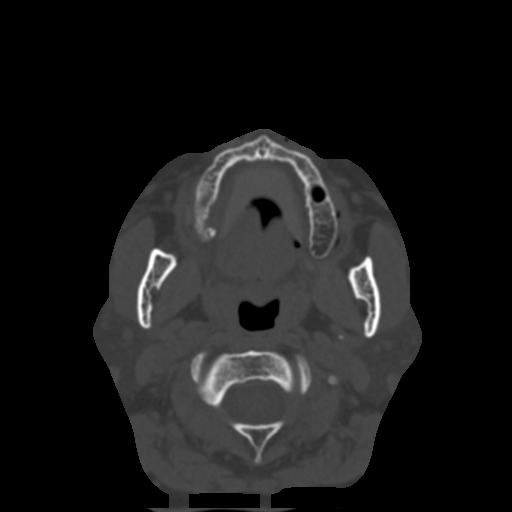
[im 26/108  bone]
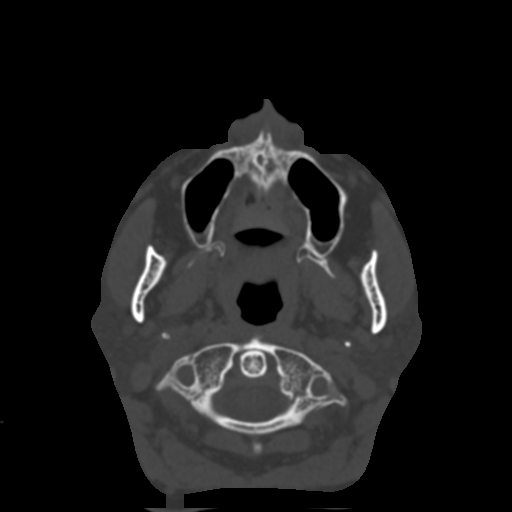
[im 30/108  brain]
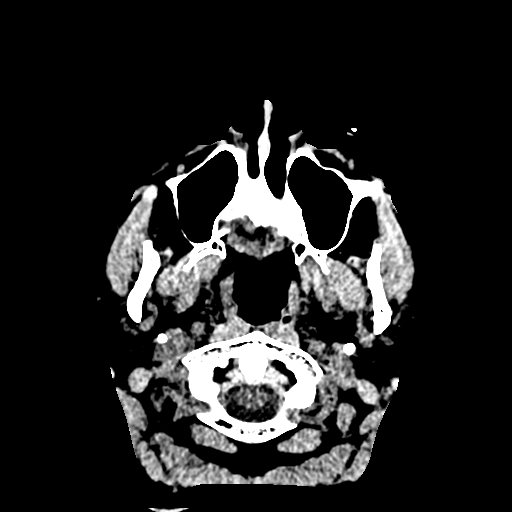
[im 30/108  bone]
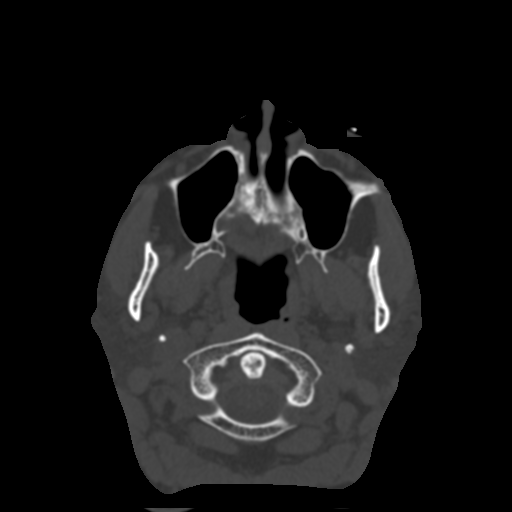
[im 37/108  bone]
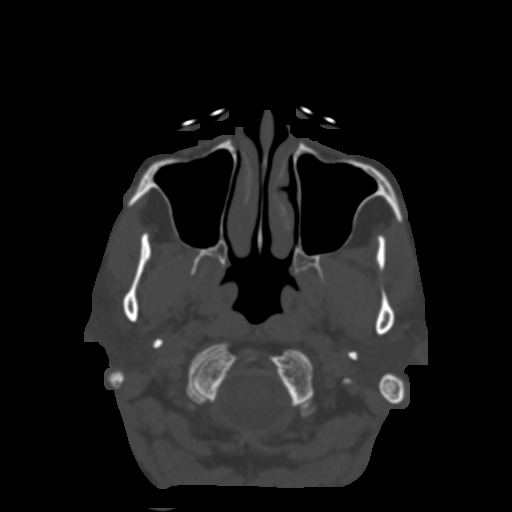
[im 45/108  bone]
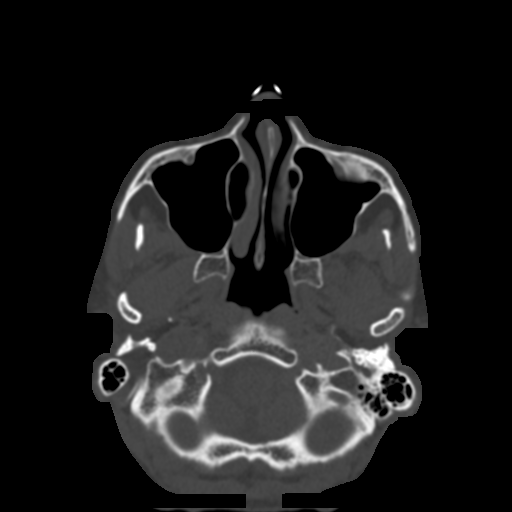
[im 52/108  bone]
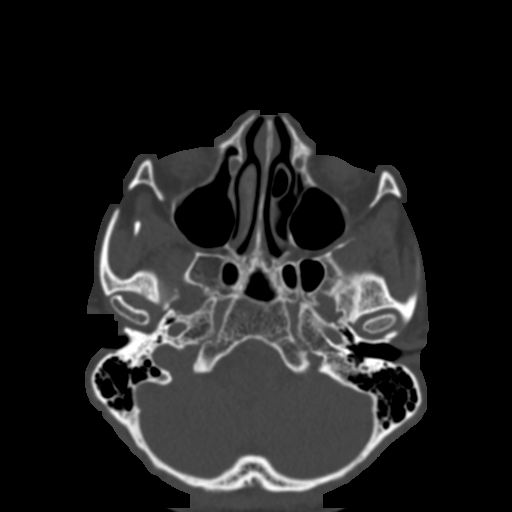
[im 56/108  brain]
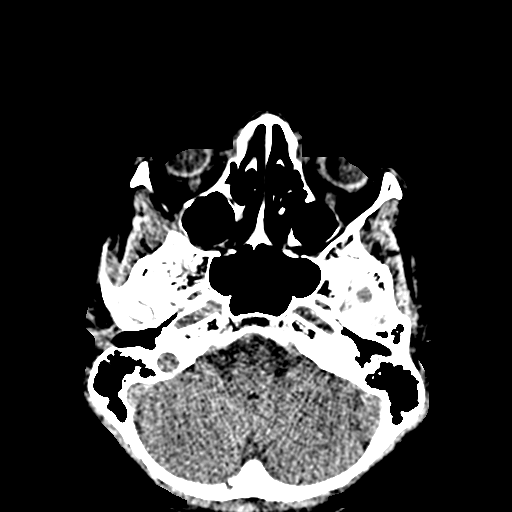
[im 56/108  bone]
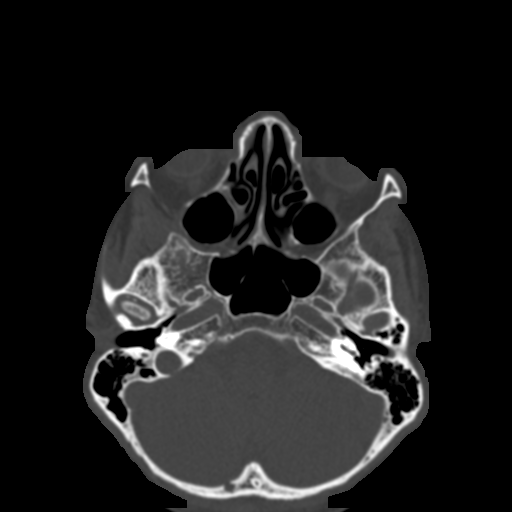
[im 63/108  bone]
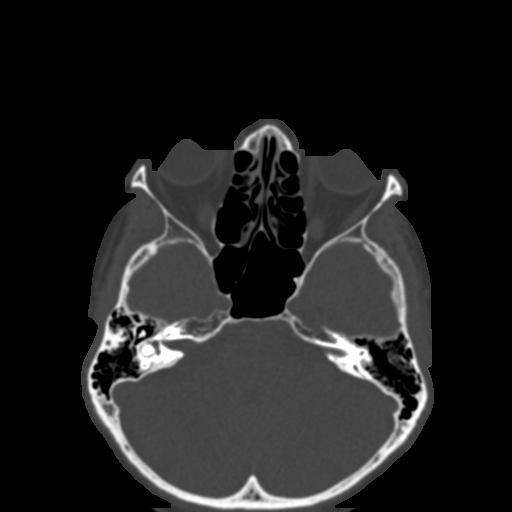
[im 71/108  bone]
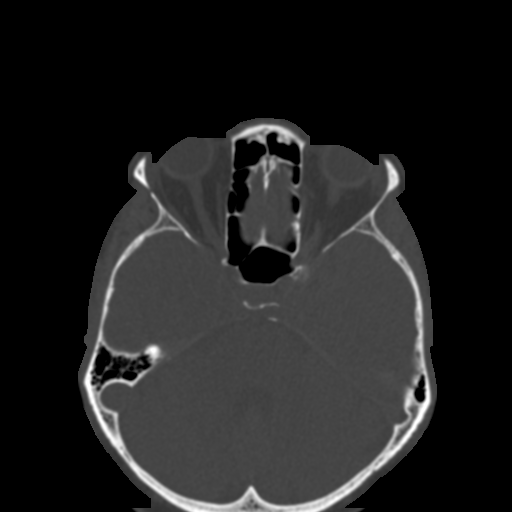
[im 78/108  bone]
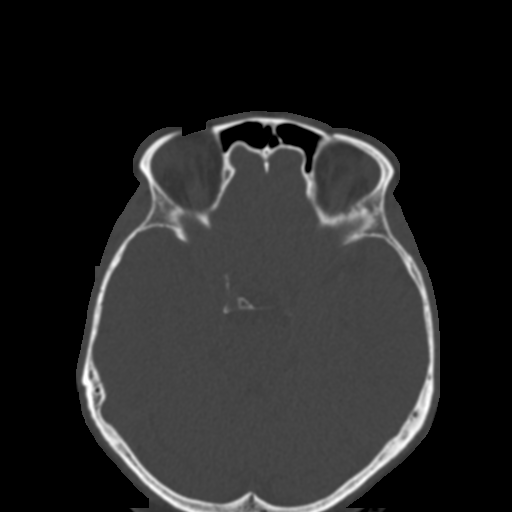
[im 82/108  brain]
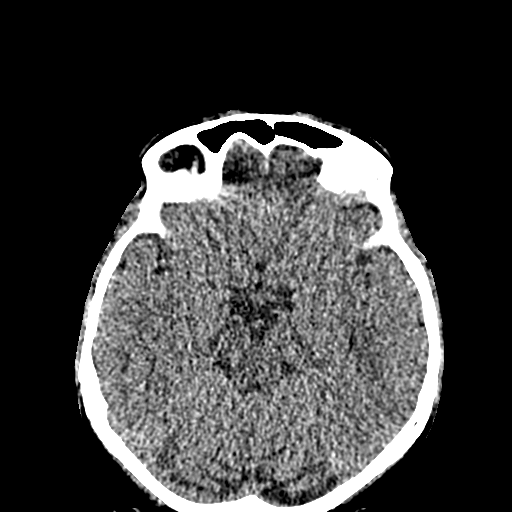
[im 82/108  bone]
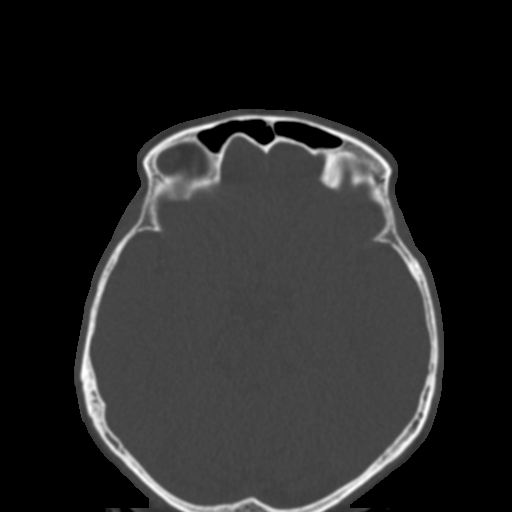
[im 89/108  bone]
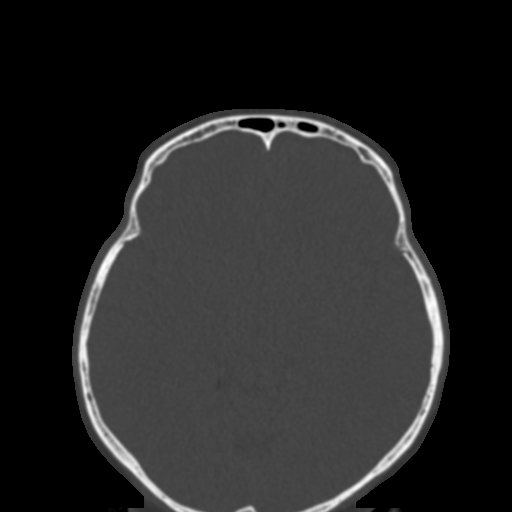
[im 96/108  bone]
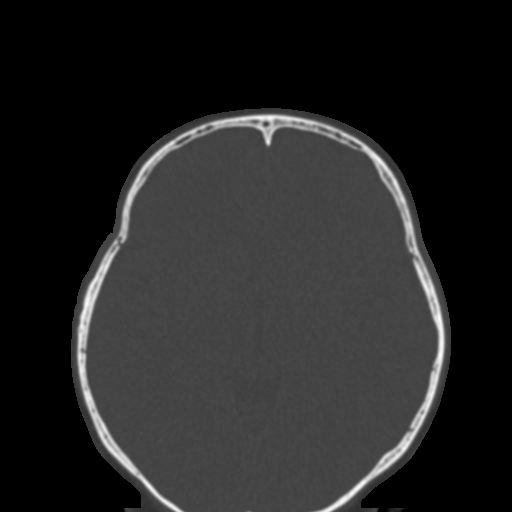
[im 104/108  bone]
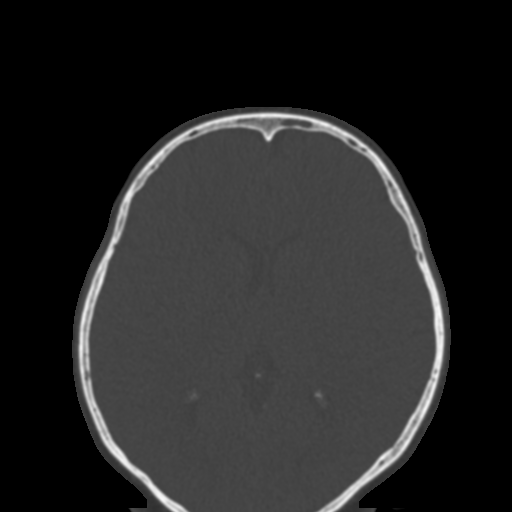

[16 of 30 positions shown; findings below may reference images not displayed]

FINDINGS: Paranasal sinuses:

Frontal: Normally aerated. Patent frontal sinus drainage pathways.

Ethmoid: Normally aerated.

Maxillary: Normally aerated.

Sphenoid: Normally aerated. Patent sphenoethmoidal recesses.

Right ostiomeatal unit: Patent despite concha bullosa.

Left ostiomeatal unit: Patent despite concha bullosa.

Nasal passages: Patent. Intact nasal septum is nearly midline.

Anatomy: No pneumatization superior to anterior ethmoid notches.
Sellar sphenoid pneumatization pattern. No dehiscence of carotid or
optic canals. Right onodi cell.

Other: No incidental finding on soft tissue windows.
IMPRESSION: Normally aerated paranasal sinuses.  Patent sinus drainage pathways.

## 2022-12-10 DIAGNOSIS — Z01411 Encounter for gynecological examination (general) (routine) with abnormal findings: Secondary | ICD-10-CM | POA: Diagnosis not present

## 2022-12-10 DIAGNOSIS — Z130 Encounter for screening for diseases of the blood and blood-forming organs and certain disorders involving the immune mechanism: Secondary | ICD-10-CM | POA: Diagnosis not present

## 2022-12-10 DIAGNOSIS — Z124 Encounter for screening for malignant neoplasm of cervix: Secondary | ICD-10-CM | POA: Diagnosis not present

## 2022-12-10 DIAGNOSIS — Z1151 Encounter for screening for human papillomavirus (HPV): Secondary | ICD-10-CM | POA: Diagnosis not present

## 2023-04-23 DIAGNOSIS — K219 Gastro-esophageal reflux disease without esophagitis: Secondary | ICD-10-CM | POA: Diagnosis not present

## 2023-04-23 DIAGNOSIS — T781XXD Other adverse food reactions, not elsewhere classified, subsequent encounter: Secondary | ICD-10-CM | POA: Diagnosis not present

## 2023-04-23 DIAGNOSIS — H1045 Other chronic allergic conjunctivitis: Secondary | ICD-10-CM | POA: Diagnosis not present

## 2023-04-23 DIAGNOSIS — R052 Subacute cough: Secondary | ICD-10-CM | POA: Diagnosis not present

## 2023-04-23 DIAGNOSIS — J3089 Other allergic rhinitis: Secondary | ICD-10-CM | POA: Diagnosis not present

## 2023-07-05 DIAGNOSIS — M25551 Pain in right hip: Secondary | ICD-10-CM | POA: Diagnosis not present

## 2023-08-12 ENCOUNTER — Other Ambulatory Visit: Payer: Self-pay | Admitting: *Deleted

## 2023-08-17 DIAGNOSIS — M25551 Pain in right hip: Secondary | ICD-10-CM | POA: Diagnosis not present

## 2023-09-08 DIAGNOSIS — M25551 Pain in right hip: Secondary | ICD-10-CM | POA: Diagnosis not present

## 2023-10-02 DIAGNOSIS — M25551 Pain in right hip: Secondary | ICD-10-CM | POA: Diagnosis not present

## 2023-10-07 DIAGNOSIS — M25551 Pain in right hip: Secondary | ICD-10-CM | POA: Diagnosis not present

## 2023-10-07 DIAGNOSIS — M87051 Idiopathic aseptic necrosis of right femur: Secondary | ICD-10-CM | POA: Diagnosis not present

## 2023-10-12 DIAGNOSIS — M87051 Idiopathic aseptic necrosis of right femur: Secondary | ICD-10-CM | POA: Diagnosis not present

## 2023-12-10 DIAGNOSIS — M87051 Idiopathic aseptic necrosis of right femur: Secondary | ICD-10-CM | POA: Diagnosis not present

## 2024-02-04 DIAGNOSIS — Z1231 Encounter for screening mammogram for malignant neoplasm of breast: Secondary | ICD-10-CM | POA: Diagnosis not present

## 2024-03-02 HISTORY — PX: TOTAL HIP ARTHROPLASTY: SHX124

## 2024-03-15 DIAGNOSIS — M1611 Unilateral primary osteoarthritis, right hip: Secondary | ICD-10-CM | POA: Diagnosis not present

## 2024-03-21 DIAGNOSIS — R262 Difficulty in walking, not elsewhere classified: Secondary | ICD-10-CM | POA: Diagnosis not present

## 2024-03-21 DIAGNOSIS — M1611 Unilateral primary osteoarthritis, right hip: Secondary | ICD-10-CM | POA: Diagnosis not present

## 2024-03-21 DIAGNOSIS — M6281 Muscle weakness (generalized): Secondary | ICD-10-CM | POA: Diagnosis not present

## 2024-04-04 DIAGNOSIS — M1611 Unilateral primary osteoarthritis, right hip: Secondary | ICD-10-CM | POA: Diagnosis not present

## 2024-04-04 DIAGNOSIS — M6281 Muscle weakness (generalized): Secondary | ICD-10-CM | POA: Diagnosis not present

## 2024-04-04 DIAGNOSIS — R262 Difficulty in walking, not elsewhere classified: Secondary | ICD-10-CM | POA: Diagnosis not present

## 2024-04-14 DIAGNOSIS — R262 Difficulty in walking, not elsewhere classified: Secondary | ICD-10-CM | POA: Diagnosis not present

## 2024-04-14 DIAGNOSIS — M6281 Muscle weakness (generalized): Secondary | ICD-10-CM | POA: Diagnosis not present

## 2024-04-14 DIAGNOSIS — M1611 Unilateral primary osteoarthritis, right hip: Secondary | ICD-10-CM | POA: Diagnosis not present

## 2024-04-15 ENCOUNTER — Encounter: Payer: Self-pay | Admitting: *Deleted

## 2024-04-15 ENCOUNTER — Other Ambulatory Visit: Payer: Self-pay

## 2024-04-15 ENCOUNTER — Ambulatory Visit: Admission: EM | Admit: 2024-04-15 | Discharge: 2024-04-15 | Disposition: A | Discharge status: Home / Self Care

## 2024-04-15 DIAGNOSIS — B9689 Other specified bacterial agents as the cause of diseases classified elsewhere: Secondary | ICD-10-CM

## 2024-04-15 DIAGNOSIS — J019 Acute sinusitis, unspecified: Secondary | ICD-10-CM

## 2024-04-15 DIAGNOSIS — R051 Acute cough: Secondary | ICD-10-CM | POA: Diagnosis not present

## 2024-04-15 MED ORDER — DOXYCYCLINE HYCLATE 100 MG PO CAPS: 100.0000 mg | ORAL_CAPSULE | Freq: Two times a day (BID) | ORAL | 0 refills | Status: AC

## 2024-04-15 MED ORDER — PROMETHAZINE-DM 6.25-15 MG/5ML PO SYRP: 5.0000 mL | ORAL_SOLUTION | Freq: Four times a day (QID) | ORAL | 0 refills | Status: DC | PRN

## 2024-04-15 NOTE — Discharge Instructions (Signed)
 Doxycycline  (antibiotic) twice daily for 7 days in a row Take with food to avoid upset stomach. Finish all the pills!  The promethazine  DM cough syrup can be used up to 4 times daily. If this medication makes you drowsy, take only once before bed.  Please return if no improvement after 4 full days on the antibiotic

## 2024-04-15 NOTE — ED Triage Notes (Signed)
 Pt reports congestion, cough, ears feel squeaky when she blows her nose. States she has seen yellow-brown drainage from nose. Sx x 1 week. Taking mucinex blue box, Robitussin DM, and tylenol, last at 10 pm yesterday. She had hip replacement August 7th

## 2024-04-15 NOTE — ED Provider Notes (Signed)
 [name]    CSN: [id number] Arrival date & time: 04/15/24  1122      History   Chief Complaint Chief Complaint  Patient presents with   Nasal Congestion   Cough    HPI Teresa Orr is a 68 y.o. female.  7 day history of nasal congestion, cough, ear and sinus pressure Congestion gradually increasing over the week, without improvement  Cough is slight productive of clear sputum. No shortness of breath or wheezing, no chest pain/tightness  No fevers/chills  She is using robitussin DM, mucinex, and tylenol  Unknown if sick contacts  No recent travel   Hip replacement (right) August 7th. Ambulating without difficulty. No pain in hip or leg. No unilateral leg swelling   Past Medical History:  Diagnosis Date   Allergy    Anxiety    GERD (gastroesophageal reflux disease)    Sickle cell trait (HCC)    Status post dilation of esophageal narrowing     Patient Active Problem List   Diagnosis Date Noted   DIZZINESS 02/06/2008   BACK PAIN 12/29/2007   PERIPHERAL EDEMA 12/29/2007   ABDOMINAL PAIN, LEFT LOWER QUADRANT 12/29/2007   ANXIETY 04/15/2007   DEPRESSION 04/15/2007   INSOMNIA 04/15/2007   ANEMIA-IRON DEFICIENCY 04/09/2007   ESOPHAGITIS 04/09/2007   GERD 04/09/2007   IBS 04/09/2007   ALLERGY, HX OF 04/09/2007    Past Surgical History:  Procedure Laterality Date   CESAREAN SECTION     COLONOSCOPY     TOTAL HIP ARTHROPLASTY Right     OB History   No obstetric history on file.      Home Medications    Prior to Admission medications   Medication Sig Start Date End Date Taking? Authorizing Provider  Cimetidine (TAGAMET PO) Take by mouth.   Yes [provider]  dextromethorphan-guaiFENesin (MUCINEX DM) 30-600 MG 12hr tablet Take 1 tablet by mouth once.   Yes [provider]  doxycycline  (VIBRAMYCIN ) 100 MG capsule Take 1 capsule (100 mg total) by mouth 2 (two) times daily for 7 days. 04/15/24 04/22/24 Yes Carroll Lingelbach, [place],  PA-C  fluticasone  (FLONASE ) 50 MCG/ACT nasal spray Place 2 sprays into both nostrils daily.   Yes [provider]  methocarbamol  (ROBAXIN ) 500 MG tablet Take 500 mg by mouth every 6 (six) hours as needed. 02/28/24  Yes [provider]  mometasone (NASONEX) 50 MCG/ACT nasal spray Place 2 sprays into the nose. 02/04/16  Yes [provider]  promethazine -dextromethorphan (PROMETHAZINE -DM) 6.25-15 MG/5ML syrup Take 5 mLs by mouth 4 (four) times daily as needed for cough. Use only at bedtime if this medication makes you drowsy 04/15/24  Yes Odilon Cass, [place], PA-C    Family History Family History  Adopted: Yes  Problem Relation Age of Onset   Lung cancer Mother    Cancer Mother    Diabetes Mother    Diabetes Sister    Hyperlipidemia Sister    Colon polyps Sister    Diabetes Brother    Hyperlipidemia Brother    Diabetes Other        10 out of 13 siblings   Hypertension Other        siblings   Colon cancer Neg Hx    Esophageal cancer Neg Hx    Rectal cancer Neg Hx    Stomach cancer Neg Hx     Social History Social History   Tobacco Use   Smoking status: Never   Smokeless tobacco: Never  Vaping Use   Vaping status:  Never Used  Substance Use Topics   Alcohol use: Yes    Alcohol/week: 2.0 standard drinks of alcohol    Types: 2 Standard drinks or equivalent per week    Comment: wine 2x week   Drug use: No     Allergies   Nexium [esomeprazole magnesium] and Tussionex pennkinetic er [hydrocod poli-chlorphe poli er]   Review of Systems Review of Systems As per HPI  Physical Exam Triage Vital Signs ED Triage Vitals  Encounter Vitals Group     BP 04/15/24 1144 (!) 144/84     Girls Systolic BP Percentile --      Girls Diastolic BP Percentile --      Boys Systolic BP Percentile --      Boys Diastolic BP Percentile --      Pulse Rate 04/15/24 1144 83     Resp 04/15/24 1144 16     Temp 04/15/24 1144 98.1 F (36.7 C)     Temp Source 04/15/24 1144  Oral     SpO2 04/15/24 1144 99 %     Weight --      Height --      Head Circumference --      Peak Flow --      Pain Score 04/15/24 1137 5     Pain Loc --      Pain Education --      Exclude from Growth Chart --    No data found.  Updated Vital Signs BP (!) 144/84 (BP Location: Left Arm)   Pulse 83   Temp 98.1 F (36.7 C) (Oral)   Resp 16   SpO2 99%    Physical Exam Vitals and nursing note reviewed.  Constitutional:      General: She is not in acute distress.    Appearance: She is not ill-appearing.  HENT:     Right Ear: Tympanic membrane and ear canal normal.     Left Ear: Tympanic membrane and ear canal normal.     Nose: Congestion present. No rhinorrhea.     Mouth/Throat:     Mouth: Mucous membranes are moist.     Pharynx: Oropharynx is clear. No posterior oropharyngeal erythema.  Eyes:     Conjunctiva/sclera: Conjunctivae normal.  Cardiovascular:     Rate and Rhythm: Normal rate and regular rhythm.     Pulses: Normal pulses.     Heart sounds: Normal heart sounds.  Pulmonary:     Effort: Pulmonary effort is normal. No respiratory distress.     Breath sounds: Normal breath sounds. No wheezing, rhonchi or rales.     Comments: Clear lungs throughout  Abdominal:     Palpations: Abdomen is soft.     Tenderness: There is no abdominal tenderness.  Musculoskeletal:        General: Normal range of motion.     Cervical back: Normal range of motion. No rigidity.  Lymphadenopathy:     Cervical: No cervical adenopathy.  Skin:    General: Skin is warm and dry.  Neurological:     Mental Status: She is alert and oriented to person, place, and time.     Motor: No weakness.     Gait: Gait normal.    UC Treatments / Results  Labs (all labs ordered are listed, but only abnormal results are displayed) Labs Reviewed - No data to display  EKG  Radiology No results found.  Procedures Procedures   Medications Ordered in UC Medications - No data to  display  Initial Impression / Assessment and Plan / UC Course  I have reviewed the triage vital signs and the nursing notes.  Pertinent labs & imaging results that were available during my care of the patient were reviewed by me and considered in my medical decision making (see chart for details).  Afebrile, well appearing, clear lungs. Sating 99% room air With 7 days of progressing symptoms, treat for acute bacterial sinusitis with doxycycline  BID x 7 days. Advised this would also cover for atypical lung infection. Promethazine  DM sent with drowsy precautions, and understands not to use her home DM products while taking.  Advised return precautions Agrees to plan, no questions at this time  Final Clinical Impressions(s) / UC Diagnoses   Final diagnoses:  Acute bacterial sinusitis  Acute cough     Discharge Instructions      Doxycycline  (antibiotic) twice daily for 7 days in a row Take with food to avoid upset stomach. Finish all the pills!  The promethazine  DM cough syrup can be used up to 4 times daily. If this medication makes you drowsy, take only once before bed.  Please return if no improvement after 4 full days on the antibiotic      ED Prescriptions     Medication Sig Dispense Auth. Provider   doxycycline  (VIBRAMYCIN ) 100 MG capsule Take 1 capsule (100 mg total) by mouth 2 (two) times daily for 7 days. 14 capsule Ayanna Gheen, PA-C   promethazine -dextromethorphan (PROMETHAZINE -DM) 6.25-15 MG/5ML syrup Take 5 mLs by mouth 4 (four) times daily as needed for cough. Use only at bedtime if this medication makes you drowsy 240 mL Errik Mitchelle, [place], PA-C      PDMP not reviewed this encounter.   Magnolia Mattila, [place], PA-C 04/15/24 1221

## 2024-04-17 DIAGNOSIS — M1611 Unilateral primary osteoarthritis, right hip: Secondary | ICD-10-CM | POA: Diagnosis not present

## 2024-04-24 DIAGNOSIS — M6281 Muscle weakness (generalized): Secondary | ICD-10-CM | POA: Diagnosis not present

## 2024-04-24 DIAGNOSIS — R262 Difficulty in walking, not elsewhere classified: Secondary | ICD-10-CM | POA: Diagnosis not present

## 2024-04-24 DIAGNOSIS — M1611 Unilateral primary osteoarthritis, right hip: Secondary | ICD-10-CM | POA: Diagnosis not present

## 2024-05-02 DIAGNOSIS — M1611 Unilateral primary osteoarthritis, right hip: Secondary | ICD-10-CM | POA: Diagnosis not present

## 2024-05-02 DIAGNOSIS — M6281 Muscle weakness (generalized): Secondary | ICD-10-CM | POA: Diagnosis not present

## 2024-05-02 DIAGNOSIS — R262 Difficulty in walking, not elsewhere classified: Secondary | ICD-10-CM | POA: Diagnosis not present

## 2024-05-10 DIAGNOSIS — R262 Difficulty in walking, not elsewhere classified: Secondary | ICD-10-CM | POA: Diagnosis not present

## 2024-05-10 DIAGNOSIS — M1611 Unilateral primary osteoarthritis, right hip: Secondary | ICD-10-CM | POA: Diagnosis not present

## 2024-05-10 DIAGNOSIS — M6281 Muscle weakness (generalized): Secondary | ICD-10-CM | POA: Diagnosis not present

## 2024-05-30 DIAGNOSIS — R262 Difficulty in walking, not elsewhere classified: Secondary | ICD-10-CM | POA: Diagnosis not present

## 2024-05-30 DIAGNOSIS — M6281 Muscle weakness (generalized): Secondary | ICD-10-CM | POA: Diagnosis not present

## 2024-05-30 DIAGNOSIS — M1611 Unilateral primary osteoarthritis, right hip: Secondary | ICD-10-CM | POA: Diagnosis not present

## 2024-06-01 DIAGNOSIS — Z01419 Encounter for gynecological examination (general) (routine) without abnormal findings: Secondary | ICD-10-CM | POA: Diagnosis not present

## 2024-06-12 DIAGNOSIS — Z471 Aftercare following joint replacement surgery: Secondary | ICD-10-CM | POA: Diagnosis not present

## 2024-06-12 DIAGNOSIS — Z96641 Presence of right artificial hip joint: Secondary | ICD-10-CM | POA: Diagnosis not present

## 2024-07-03 ENCOUNTER — Ambulatory Visit

## 2024-07-03 VITALS — Ht 65.5 in | Wt 165.0 lb

## 2024-07-03 DIAGNOSIS — Z860100 Personal history of colon polyps, unspecified: Secondary | ICD-10-CM

## 2024-07-03 MED ORDER — NA SULFATE-K SULFATE-MG SULF 17.5-3.13-1.6 GM/177ML PO SOLN: 1.0000 | Freq: Once | ORAL | 0 refills | Status: AC

## 2024-07-03 NOTE — Progress Notes (Signed)
 Pre visit completed via phone call; Patient verified name, DOB, and address; No egg or soy allergy known to patient;  No issues known to pt with past sedation with any surgeries or procedures; Patient denies ever being told they had issues or difficulty with intubation;  No FH of Malignant Hyperthermia; Pt is not on diet pills; Pt is not on home 02;  Pt is not on blood thinners  Pt denies issues with constipation;  No A fib or A flutter; Have any cardiac testing pending--NO Insurance verified during PV appt--- [employer]BS Pt can ambulate without assistance;  Pt denies use of chewing tobacco; Discussed diabetic/weight loss medication holds; Discussed NSAID holds; Checked BMI to be less than 50; Pt instructed to use [employer] or GoodRx for a price reduction on prep;  Patient's chart reviewed by Norleen Schillings CNRA prior to previsit and patient appropriate for the [facility];  Pre visit completed and red dot placed by patient's name on their procedure day (on provider's schedule);   Instructions sent to MyChart as well printed and mailed to the patient per her request;

## 2024-07-25 ENCOUNTER — Encounter: Admitting: Internal Medicine

## 2024-08-11 ENCOUNTER — Encounter: Payer: Self-pay | Admitting: Internal Medicine

## 2024-08-15 ENCOUNTER — Telehealth: Payer: Self-pay | Admitting: Internal Medicine

## 2024-08-15 NOTE — Telephone Encounter (Signed)
 Good morning Dr. Albertus,    I received a call from this patient stating that she is needing to cancel her procedure due to her job and letting her take off. Patient was scheduled for January the 26 th for a colonoscopy with you. Patient stated that she would like to reschedule for April. Please advise.    Thank you.

## 2024-08-15 NOTE — Telephone Encounter (Signed)
 Ok to reschedule per patient request JMP

## 2024-08-15 NOTE — Telephone Encounter (Signed)
 Olam, do you need me to reach back out to patient?

## 2024-08-21 ENCOUNTER — Encounter: Admitting: Internal Medicine

## 2024-09-01 ENCOUNTER — Encounter: Payer: Self-pay | Admitting: Internal Medicine

## 2024-10-23 ENCOUNTER — Encounter

## 2024-11-06 ENCOUNTER — Encounter: Admitting: Internal Medicine
